# Patient Record
Sex: Female | Born: 1965 | Race: White | Hispanic: No | Marital: Married | Smoking: Former smoker
Health system: Southern US, Community
[De-identification: ages and names within clinical notes are randomized; demographics above are authoritative.]

## PROBLEM LIST (undated history)

## (undated) DIAGNOSIS — F909 Attention-deficit hyperactivity disorder, unspecified type: Secondary | ICD-10-CM

## (undated) DIAGNOSIS — M797 Fibromyalgia: Secondary | ICD-10-CM

## (undated) DIAGNOSIS — E039 Hypothyroidism, unspecified: Secondary | ICD-10-CM

## (undated) DIAGNOSIS — R000 Tachycardia, unspecified: Secondary | ICD-10-CM

## (undated) DIAGNOSIS — D509 Iron deficiency anemia, unspecified: Secondary | ICD-10-CM

## (undated) DIAGNOSIS — E611 Iron deficiency: Secondary | ICD-10-CM

## (undated) HISTORY — PX: LASIK: SHX215

## (undated) HISTORY — PX: CHOLECYSTECTOMY: SHX55

## (undated) HISTORY — PX: KNEE ARTHROSCOPY: SHX127

## (undated) HISTORY — DX: Iron deficiency anemia, unspecified: D50.9

## (undated) HISTORY — PX: GASTRIC BYPASS: SHX52

## (undated) HISTORY — DX: Iron deficiency: E61.1

## (undated) HISTORY — PX: BACK SURGERY: SHX140

## (undated) HISTORY — DX: Attention-deficit hyperactivity disorder, unspecified type: F90.9

---

## 2000-03-22 ENCOUNTER — Ambulatory Visit (HOSPITAL_COMMUNITY): Admission: RE | Admit: 2000-03-22 | Discharge: 2000-03-23 | Payer: Self-pay | Admitting: Neurosurgery

## 2001-02-10 ENCOUNTER — Encounter: Admission: RE | Admit: 2001-02-10 | Discharge: 2001-02-10 | Payer: Self-pay | Admitting: Neurosurgery

## 2004-03-13 ENCOUNTER — Encounter: Admission: RE | Admit: 2004-03-13 | Discharge: 2004-06-11 | Payer: Self-pay | Admitting: Surgery

## 2004-03-21 ENCOUNTER — Ambulatory Visit (HOSPITAL_COMMUNITY): Admission: RE | Admit: 2004-03-21 | Discharge: 2004-03-21 | Payer: Self-pay | Admitting: Surgery

## 2004-04-03 ENCOUNTER — Ambulatory Visit (HOSPITAL_COMMUNITY): Admission: RE | Admit: 2004-04-03 | Discharge: 2004-04-03 | Payer: Self-pay | Admitting: Surgery

## 2004-04-04 ENCOUNTER — Ambulatory Visit (HOSPITAL_COMMUNITY): Admission: RE | Admit: 2004-04-04 | Discharge: 2004-04-04 | Payer: Self-pay | Admitting: Surgery

## 2004-06-23 ENCOUNTER — Inpatient Hospital Stay (HOSPITAL_COMMUNITY): Admission: RE | Admit: 2004-06-23 | Discharge: 2004-06-27 | Payer: Self-pay | Admitting: Surgery

## 2004-07-04 ENCOUNTER — Encounter: Admission: RE | Admit: 2004-07-04 | Discharge: 2004-10-02 | Payer: Self-pay | Admitting: Surgery

## 2009-01-09 ENCOUNTER — Ambulatory Visit: Payer: Self-pay | Admitting: Urology

## 2009-01-30 ENCOUNTER — Emergency Department: Payer: Self-pay | Admitting: Unknown Physician Specialty

## 2010-07-05 ENCOUNTER — Encounter: Payer: Self-pay | Admitting: Surgery

## 2011-09-20 ENCOUNTER — Emergency Department (HOSPITAL_COMMUNITY)
Admission: EM | Admit: 2011-09-20 | Discharge: 2011-09-21 | Disposition: A | Discharge status: Home / Self Care | Payer: 59 | Attending: Emergency Medicine | Admitting: Emergency Medicine

## 2011-09-20 ENCOUNTER — Encounter (HOSPITAL_COMMUNITY): Payer: Self-pay | Admitting: Emergency Medicine

## 2011-09-20 DIAGNOSIS — J40 Bronchitis, not specified as acute or chronic: Secondary | ICD-10-CM

## 2011-09-20 DIAGNOSIS — Z79899 Other long term (current) drug therapy: Secondary | ICD-10-CM | POA: Insufficient documentation

## 2011-09-20 DIAGNOSIS — E039 Hypothyroidism, unspecified: Secondary | ICD-10-CM | POA: Insufficient documentation

## 2011-09-20 DIAGNOSIS — R059 Cough, unspecified: Secondary | ICD-10-CM | POA: Insufficient documentation

## 2011-09-20 DIAGNOSIS — R05 Cough: Secondary | ICD-10-CM | POA: Insufficient documentation

## 2011-09-20 DIAGNOSIS — R0602 Shortness of breath: Secondary | ICD-10-CM | POA: Insufficient documentation

## 2011-09-20 DIAGNOSIS — R062 Wheezing: Secondary | ICD-10-CM

## 2011-09-20 HISTORY — DX: Tachycardia, unspecified: R00.0

## 2011-09-20 HISTORY — DX: Hypothyroidism, unspecified: E03.9

## 2011-09-20 HISTORY — DX: Fibromyalgia: M79.7

## 2011-09-20 MED ORDER — IPRATROPIUM BROMIDE 0.02 % IN SOLN
RESPIRATORY_TRACT | Status: AC
  Administered 2011-09-20: 0.5 mg via RESPIRATORY_TRACT
  Filled 2011-09-20: qty 2.5

## 2011-09-20 MED ORDER — IPRATROPIUM BROMIDE 0.02 % IN SOLN
0.5000 mg | Freq: Once | RESPIRATORY_TRACT | Status: AC
  Administered 2011-09-20: 0.5 mg via RESPIRATORY_TRACT

## 2011-09-20 MED ORDER — ALBUTEROL SULFATE (5 MG/ML) 0.5% IN NEBU
2.5000 mg | INHALATION_SOLUTION | Freq: Once | RESPIRATORY_TRACT | Status: AC
  Administered 2011-09-20: 2.5 mg via RESPIRATORY_TRACT

## 2011-09-20 MED ORDER — ALBUTEROL SULFATE (5 MG/ML) 0.5% IN NEBU
INHALATION_SOLUTION | RESPIRATORY_TRACT | Status: AC
  Administered 2011-09-20: 2.5 mg via RESPIRATORY_TRACT
  Filled 2011-09-20: qty 1

## 2011-09-20 NOTE — ED Notes (Signed)
C/o non-productive cough since last night, sob and audible wheezing today.

## 2011-09-21 ENCOUNTER — Emergency Department (HOSPITAL_COMMUNITY): Payer: 59

## 2011-09-21 MED ORDER — ALBUTEROL SULFATE HFA 108 (90 BASE) MCG/ACT IN AERS
2.0000 | INHALATION_SPRAY | RESPIRATORY_TRACT | Status: DC | PRN
  Filled 2011-09-21: qty 6.7

## 2011-09-21 MED ORDER — PREDNISONE 20 MG PO TABS: 40.0000 mg | ORAL_TABLET | Freq: Every day | ORAL | Status: AC

## 2011-09-21 MED ORDER — IPRATROPIUM BROMIDE 0.02 % IN SOLN
0.5000 mg | Freq: Once | RESPIRATORY_TRACT | Status: AC
  Administered 2011-09-21: 0.5 mg via RESPIRATORY_TRACT
  Filled 2011-09-21: qty 2.5

## 2011-09-21 MED ORDER — PREDNISONE 20 MG PO TABS
60.0000 mg | ORAL_TABLET | Freq: Once | ORAL | Status: AC
  Administered 2011-09-21: 60 mg via ORAL
  Filled 2011-09-21: qty 3

## 2011-09-21 MED ORDER — ALBUTEROL SULFATE (5 MG/ML) 0.5% IN NEBU
5.0000 mg | INHALATION_SOLUTION | Freq: Once | RESPIRATORY_TRACT | Status: AC
  Administered 2011-09-21: 5 mg via RESPIRATORY_TRACT
  Filled 2011-09-21: qty 1

## 2011-09-21 NOTE — ED Notes (Signed)
Pt stated that she has been coughing since Saturday and today she begin having SOB. No CP. Will continue to monitor.

## 2011-09-21 NOTE — Discharge Instructions (Signed)

## 2011-09-21 NOTE — ED Provider Notes (Signed)
History     CSN: [id number]  Arrival date & time 09/20/11  2113   First MD Initiated Contact with Patient 09/21/11 0017      Chief Complaint  Patient presents with  . Shortness of Breath    (Consider location/radiation/quality/duration/timing/severity/associated sxs/prior treatment) Patient is a 46 y.o. female presenting with shortness of breath. The history is provided by the patient and the spouse.  Shortness of Breath  The current episode started today. The onset was gradual. The problem occurs continuously. The problem has been gradually worsening. The problem is severe. The symptoms are relieved by nothing. The symptoms are aggravated by activity and smoke exposure. Associated symptoms include cough, shortness of breath and wheezing. Pertinent negatives include no chest pressure, no fever and no rhinorrhea. The cough is non-productive. She has had no prior steroid use. She has had no prior hospitalizations. She has had no prior ICU admissions. She has had no prior intubations. Her past medical history is significant for past wheezing. She has been behaving normally. There were sick contacts at home (Husband recently got over pneumonia). She has received no recent medical care.    Past Medical History  Diagnosis Date  . Tachycardia   . Fibromyalgia   . Hypothyroid     Past Surgical History  Procedure Date  . Gastric bypass     No family history on file.  History  Substance Use Topics  . Smoking status: Never Smoker   . Smokeless tobacco: Not on file  . Alcohol Use: No    OB History    Grav Para Term Preterm Abortions TAB SAB Ect Mult Living                  Review of Systems  Constitutional: Negative for fever.  HENT: Negative for rhinorrhea.   Respiratory: Positive for cough, shortness of breath and wheezing.   All other systems reviewed and are negative.    Allergies  Review of patient's allergies indicates no known allergies.  Home Medications    Current Outpatient Rx  Name Route Sig Dispense Refill  . ATENOLOL 50 MG PO TABS Oral Take 50 mg by mouth daily.    Marland Kitchen CITALOPRAM HYDROBROMIDE 40 MG PO TABS Oral Take 40 mg by mouth daily.    . CYCLOBENZAPRINE HCL 10 MG PO TABS Oral Take 10 mg by mouth at bedtime.    . FUROSEMIDE 20 MG PO TABS Oral Take 20 mg by mouth daily.    . THYROID 90 MG PO TABS Oral Take 90 mg by mouth daily.    Marland Kitchen VITAMIN D (ERGOCALCIFEROL) 50000 UNITS PO CAPS Oral Take 50,000 Units by mouth every 7 (seven) days. On Sunday      BP 117/88  Pulse 100  Temp(Src) 97.3 F (36.3 C) (Oral)  Resp 21  SpO2 93%  LMP 08/30/2011  Physical Exam  Nursing note and vitals reviewed. Constitutional: She is oriented to person, place, and time. She appears well-developed and well-nourished. No distress.  HENT:  Head: Normocephalic and atraumatic.  Mouth/Throat: Oropharynx is clear and moist.  Eyes: EOM are normal. Pupils are equal, round, and reactive to light.  Cardiovascular: Normal rate, regular rhythm, normal heart sounds and intact distal pulses.  Exam reveals no friction rub.   No murmur heard. Pulmonary/Chest: Effort normal. She has wheezes in the right upper field, the right middle field, the right lower field, the left upper field, the left middle field and the left lower field. She has no rales.  Abdominal: Soft. Bowel sounds are normal. She exhibits no distension. There is no tenderness. There is no rebound and no guarding.  Musculoskeletal: Normal range of motion. She exhibits no tenderness.       No edema  Neurological: She is alert and oriented to person, place, and time. No cranial nerve deficit.  Skin: Skin is warm and dry. No rash noted.  Psychiatric: She has a normal mood and affect. Her behavior is normal.    ED Course  Procedures (including critical care time)  Labs Reviewed - No data to display Dg Chest 2 View  09/21/2011  *RADIOLOGY REPORT*  Clinical Data: Wheezing and shortness of breath.  CHEST -  2 VIEW  Comparison: Chest radiograph performed 03/21/2004  Findings: The lungs are well-aerated.  Chronic peribronchial thickening is noted.  There is no evidence of focal opacification, pleural effusion or pneumothorax.  The heart is normal in size; the mediastinal contour is within normal limits.  No acute osseous abnormalities are seen.  IMPRESSION: Chronic peribronchial thickening; lungs otherwise clear.  Original Report Authenticated By: Tonia Ghent, M.D.     1. Bronchitis   2. Wheezing       MDM   Patient with cough, wheezing, shortness of breath that started at 2 AM Sunday morning. Patient states it just worsened throughout the day and that's what made her decide to come today. On arrival here she was wheezing, tachypneic and very short of breath with an O2 sat of 93%. She states after her breathing treatments here Shireen Quan and feel better however on exam she still wheezing and mildly tachypneic. She states last night was the first time she had smoked in over a year the husband states she's been coughing intermittently for the last few days. Feel patient has most likely URI as well as some bronchospasm from the recent cigarette smoke exposure.  Will give second treatment of albuterol and Atrovent and prednisone. Chest x-ray pending.  2:00 AM  Patient is moving air better on exam. Still having diffuse wheezing but improved. Chest x-ray negative for acute findings other than bronchitis. Patient was tachycardic at 127 after having albuterol. Patient given prescription for albuterol inhaler and prednisone.  REcheck of HR improved to 95 with O2 sats of 95 prior to d/c.     Gwyneth Sprout, MD 09/21/11 [phone]

## 2012-02-10 ENCOUNTER — Encounter (HOSPITAL_COMMUNITY): Payer: Self-pay | Admitting: *Deleted

## 2012-02-10 ENCOUNTER — Emergency Department (INDEPENDENT_AMBULATORY_CARE_PROVIDER_SITE_OTHER): Payer: 59

## 2012-02-10 ENCOUNTER — Emergency Department (HOSPITAL_COMMUNITY)
Admission: EM | Admit: 2012-02-10 | Discharge: 2012-02-10 | Disposition: A | Discharge status: Home / Self Care | Payer: 59 | Source: Home / Self Care | Attending: Family Medicine | Admitting: Family Medicine

## 2012-02-10 DIAGNOSIS — S9030XA Contusion of unspecified foot, initial encounter: Secondary | ICD-10-CM

## 2012-02-10 DIAGNOSIS — S9031XA Contusion of right foot, initial encounter: Secondary | ICD-10-CM

## 2012-02-10 MED ORDER — DICLOFENAC POTASSIUM 50 MG PO TABS: 50.0000 mg | ORAL_TABLET | Freq: Three times a day (TID) | ORAL | Status: DC

## 2012-02-10 NOTE — ED Provider Notes (Signed)
History     CSN: [id number]  Arrival date & time 02/10/12  1814   First MD Initiated Contact with Patient 02/10/12 1832      Chief Complaint  Patient presents with  . Foot Pain    (Consider location/radiation/quality/duration/timing/severity/associated sxs/prior treatment) Patient is a 46 y.o. female presenting with foot injury. The history is provided by the patient.  Foot Injury  The incident occurred more than 1 week ago. Injury mechanism: tubing and walked on river rocks without shoes and began with right foot pain 2-3 days after, continues sore. The pain is present in the right foot. The quality of the pain is described as sharp. The pain is moderate. She reports no foreign bodies present. The symptoms are aggravated by bearing weight, palpation and activity.    Past Medical History  Diagnosis Date  . Tachycardia   . Fibromyalgia   . Hypothyroid     Past Surgical History  Procedure Date  . Gastric bypass     History reviewed. No pertinent family history.  History  Substance Use Topics  . Smoking status: Never Smoker   . Smokeless tobacco: Not on file  . Alcohol Use: No    OB History    Grav Para Term Preterm Abortions TAB SAB Ect Mult Living                  Review of Systems  Constitutional: Negative.   Musculoskeletal: Positive for gait problem.  Skin: Negative.     Allergies  Review of patient's allergies indicates no known allergies.  Home Medications   Current Outpatient Rx  Name Route Sig Dispense Refill  . ATENOLOL 50 MG PO TABS Oral Take 50 mg by mouth daily.    . BUPROPION HCL ER (XL) 150 MG PO TB24 Oral Take 150 mg by mouth daily.    Marland Kitchen VITAMIN D (ERGOCALCIFEROL) 50000 UNITS PO CAPS Oral Take 50,000 Units by mouth every 7 (seven) days. On Sunday    . CITALOPRAM HYDROBROMIDE 40 MG PO TABS Oral Take 40 mg by mouth daily.    . CYCLOBENZAPRINE HCL 10 MG PO TABS Oral Take 10 mg by mouth at bedtime.    Marland Kitchen DICLOFENAC POTASSIUM 50 MG PO TABS Oral  Take 1 tablet (50 mg total) by mouth 3 (three) times daily. 30 tablet 0  . FUROSEMIDE 20 MG PO TABS Oral Take 20 mg by mouth daily.    . THYROID 90 MG PO TABS Oral Take 90 mg by mouth daily.      BP 116/56  Pulse 80  Temp 98.8 F (37.1 C) (Oral)  Resp 20  SpO2 100%  LMP 12/30/2011  Physical Exam  Nursing note and vitals reviewed. Constitutional: She appears well-developed and well-nourished.  Musculoskeletal: She exhibits tenderness.       Right foot: She exhibits tenderness.       Feet:  Skin: Skin is warm and dry.    ED Course  Procedures (including critical care time)  Labs Reviewed - No data to display No results found.   1. Contusion of foot, right       MDM  X-rays reviewed and report per radiologist.         Linna Hoff, MD 02/14/12 [phone]

## 2012-02-10 NOTE — ED Notes (Signed)
Pt stepped on a sharp rock about 3 weeks ago and is still having pain in the right foot

## 2012-02-17 ENCOUNTER — Ambulatory Visit (INDEPENDENT_AMBULATORY_CARE_PROVIDER_SITE_OTHER): Payer: 59 | Admitting: Surgery

## 2012-02-17 ENCOUNTER — Encounter (INDEPENDENT_AMBULATORY_CARE_PROVIDER_SITE_OTHER): Payer: Self-pay | Admitting: Surgery

## 2012-02-17 DIAGNOSIS — Z9884 Bariatric surgery status: Secondary | ICD-10-CM | POA: Insufficient documentation

## 2012-02-17 NOTE — Progress Notes (Addendum)
Re:   Jessica Nunez DOB:   [birth year] MRN:   [id number]  ASSESSMENT AND PLAN: 1.  Morbid obesity.  Wants to consider a revision.  I'm not sure how much can/should be done until her fatigue is better addressed.  But I will start with an UGI to evaluate the pouch.  I also will get records form [name], NP, in [place] (supervising physician Dr. Evelena Asa).  I will review what labs have already been drawn.  [Her UGI is totally normal.  DN 02/22/2012]  2.  History of RYGB by Dr. Algis Downs. Jessica Nunez - 06/23/2004.  Her initial weight before gastric bypass - 232, BMI - 40.6 3.  History of chronic back pain. 4.  History of kidney stones. 5.  History of cigarette smoking.  Quit again in 2012. 6.  History of Fibromyalgia. 7.  On thyroid replacement  Armour extract for "fatigue"  TSH -  0.570 - 09/29/2011 8.  Low Vitamin D 9.  At least one hypoglycemic episode. 10.  ADD - on Wellbutrin  REFERRING PHYSICIAN: [name], MD  HISTORY OF PRESENT ILLNESS: Jessica Nunez is a 45 y.o. (DOB: Oct 08, 1965)  white female whose primary care physician is [name], NP, [place], [place], (FAX: [phone])( works with Dr. Vonita Moss) and comes to me today for weight gain after RYGB.  The patient had a gastric bypass on 23 June 2004 by Dr. Algis Downs. [place]. The last time I saw her was 21st of July 2006 and her weight at that time was 187 pounds and her BMI 33.1. She said her weight was stable at 167 pounds in 2011. [I did find in her records a weight of 167 in 2010 and a weight of 171 in 2011 and a weight of  206 in 2012.  Seen since 06/2011 by C. Wicker for "fatigue" about monthly.  Reviewed FAXed records from C. Wicker.  DN  02/21/2012]  Then between 2011-2012 she developed extreme fatigue and muscle soreness. She said she just could not move and she would be exhausted by the end of the day.  She's also had some constipation. She takes Colace about once a week and has vomiting which were 3 weeks, sometimes in the middle  the night. She doesn't relate the vomiting to be certain foods.  She is seeing [name], Nurse Practitioner, in [place] who is treating her with thyroid medicine and vitamin D. She also has had loose one documented episode of hypoglycemia.  She's had no evaluation of her gastric bypass.  Though I made the point that her weight gain has come with her decreased activity, she seemed to discount this.    Past Medical History  Diagnosis Date  . Tachycardia   . Fibromyalgia   . Hypothyroid      Past Surgical History  Procedure Date  . Gastric bypass     Current Outpatient Prescriptions  Medication Sig Dispense Refill  . atenolol (TENORMIN) 50 MG tablet Take 50 mg by mouth daily.      Marland Kitchen buPROPion (WELLBUTRIN XL) 150 MG 24 hr tablet Take 150 mg by mouth daily.      . citalopram (CELEXA) 40 MG tablet Take 40 mg by mouth daily.      . cyclobenzaprine (FLEXERIL) 10 MG tablet Take 10 mg by mouth at bedtime.      . furosemide (LASIX) 20 MG tablet Take 20 mg by mouth daily.      Marland Kitchen thyroid (ARMOUR) 90 MG tablet Take 90 mg by mouth  daily.      . Vitamin D, Ergocalciferol, (DRISDOL) 50000 UNITS CAPS Take 50,000 Units by mouth every 7 (seven) days. On Sunday      . diclofenac (CATAFLAM) 50 MG tablet Take 1 tablet (50 mg total) by mouth 3 (three) times daily.  30 tablet  0     No Known Allergies  REVIEW OF SYSTEMS: Skin:  No history of rash.  No history of abnormal moles. Infection:  No history of hepatitis or HIV.  No history of MRSA. Neurologic:  No history of stroke.  No history of seizure.  No history of headaches. Cardiac:  History of tachycardias, but has not had trouble in years.  The cardiologist she saw in [facility] is no longer in practice there.  Pulmonary:  Quit smoking again in 2012.  Endocrine:  On thyroid replacement (Armour Thyroid - 9 mg).  She said her vitamin D level has been low.  And she had one episode of hypoglycemia. Gastrointestinal:  No history of liver disease.  No  history of gall bladder disease.  No history of pancreas disease.  See HPI. Urologic:  Nephrotic syndrome as a child.  History of kidney stones, but has done well the last 3 years. Musculoskeletal:  Herniated disk repaired by Dr. Terrilee Files - 2001.  Right knee arthroscopy after accident.  History of carpel tunnel syndrome. Fatigue that is not well characterized or has an accompanying diagnosis. Hematologic:  No bleeding disorder.  No history of anemia.  Not anticoagulated. Psycho-social:  The patient is oriented.   Has history of adult ADD.  She saw Dr. Radene Gunning the first time.  SOCIAL and FAMILY HISTORY: Single, but lives with Tim.  No immediate plans for marriage.  She said that because of her current problems, her relationship is "suffering". She had a bad marriage before and [name] has been married twice, so they are in no hurry to try again.. 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  PHYSICAL EXAM: BP 136/80  Pulse 80  Temp 98.6 F (37 C) (Temporal)  Resp 18  Ht 5\' 4"  (1.626 m)  Wt 221 lb 9.6 oz (100.517 kg)  BMI 38.04 kg/m2  SpO2 99%  LMP 12/30/2011  General: WN WF, moderately obese, who is alert and generally healthy appearing.  HEENT: Normal. Pupils equal. Neck: Supple. No mass.  No thyroid mass. Lymph Nodes:  No supraclavicular or cervical nodes. Lungs: Clear to auscultation and symmetric breath sounds. Heart:  RRR. No murmur or rub. Abdomen: Soft. No mass. No tenderness. No hernia. Normal bowel sounds.  Scars look okay. Rectal: Not done. Extremities:  Good strength and ROM  in upper and lower extremities. Neurologic:  Grossly intact to motor and sensory function. Psychiatric: Has normal mood and affect. Behavior is normal.   DATA REVIEWED: Old chart.  Ovidio Kin, MD,  [facility] [address] [place]  [address]   [place], [place]    [address] Phone:  [phone] FAX:  [phone]

## 2012-02-22 ENCOUNTER — Ambulatory Visit
Admission: RE | Admit: 2012-02-22 | Discharge: 2012-02-22 | Disposition: A | Discharge status: Home / Self Care | Payer: 59 | Source: Ambulatory Visit | Attending: Surgery | Admitting: Surgery

## 2012-02-22 DIAGNOSIS — Z9884 Bariatric surgery status: Secondary | ICD-10-CM

## 2012-03-09 ENCOUNTER — Encounter (INDEPENDENT_AMBULATORY_CARE_PROVIDER_SITE_OTHER): Payer: Self-pay

## 2012-03-15 ENCOUNTER — Ambulatory Visit: Payer: Self-pay

## 2012-03-16 ENCOUNTER — Ambulatory Visit (INDEPENDENT_AMBULATORY_CARE_PROVIDER_SITE_OTHER): Payer: 59 | Admitting: Surgery

## 2012-03-16 ENCOUNTER — Encounter (INDEPENDENT_AMBULATORY_CARE_PROVIDER_SITE_OTHER): Payer: Self-pay | Admitting: Surgery

## 2012-03-16 VITALS — BP 110/72 | HR 77 | Temp 98.8°F | Resp 16 | Ht 63.0 in | Wt 217.8 lb

## 2012-03-16 DIAGNOSIS — Z9884 Bariatric surgery status: Secondary | ICD-10-CM

## 2012-03-16 NOTE — Progress Notes (Signed)
Re:   Jessica Nunez DOB:   [birth year] MRN:   [id number]  ASSESSMENT AND PLAN: 1.  Morbid obesity.  She is concerned that her bypass is not working and she came with the question of whether a "revision" would help her.  Her UGI was normal.  There is no problem with the bypass surgery.  I don't think that a revision will improve her weight loss.  I reviewed records from [name], NP, in [place] (supervising physician Dr. Evelena Asa).   For now, I will let [name] workup [name].  I will see [name] back in 3 months and see how she is doing with her anemia and symtoms.  2.  History of RYGB by Dr. Algis Downs. [name] - 06/23/2004.  Her initial weight before gastric bypass - 232, BMI - 40.6 3.  History of chronic back pain.  But no chronic pain meds.  This dates back to 2000. 4.  History of kidney stones. 5.  History of cigarette smoking.  Quit again in 2012. 6.  History of Fibromyalgia. 7.  On thyroid replacement  Armour extract for "fatigue"  TSH - 0.248 - 03/07/2012 8.  Low Vitamin D  Now better - Vit D - 113 (range 30-100) - 03/07/2012 9.  At least one hypoglycemic episode. 10.  ADD - on Wellbutrin 11.  Anemic - Hgb - 7.5 - 03/09/2012  [name] is working this up and suggesting a colonoscopy.  REFERRING PHYSICIAN: [name], MD  HISTORY OF PRESENT ILLNESS: Jessica Nunez is a 46 y.o. (DOB: 1966-03-21)  white female whose primary care physician is [name], NP, [place], [place], (FAX: [phone])( works with Dr. Vonita Moss) and comes to me for follow up of a discussion about weight gain after RYGB.  I saw her 02/17/2012.  I did an UGI on 02/22/2012, which was negative.  Since I saw her, she has found out that she is anemic and [name] is working this up. She is planning some more blood work and a colonoscopy.  [name] still says that she feels nauseated and vomits every 3 ot 4 weeks.  She feels this particularly after being constipated.   Gastric Bypass  History: The patient had a gastric bypass on 23 June 2004 by Dr. Algis Downs. [name]. The last time I saw her was 21st of July 2006 and her weight at that time was 187 pounds and her BMI 33.1. She said her weight was stable at 167 pounds in 2011.  I did find in her records a weight of 167 in 2010 and a weight of 171 in 2011 and a weight of  206 in 2012.  [name] has been seen since 06/2011 by C. Wicker for "fatigue" about monthly.   Then between 2011-2012 she developed extreme fatigue and muscle soreness. She said she just could not move and she would be exhausted by the end of the day.  She's also had some constipation.  he is seeing [name], Nurse Practitioner, in [place] who is treating her with thyroid medicine and vitamin D. She also has had loose one documented episode of hypoglycemia.    [facility] FAXed records are scanned in under "media" in Epic.]    Past Medical History  Diagnosis Date  . Tachycardia   . Fibromyalgia   . Hypothyroid      Past Surgical History  Procedure Date  . Gastric bypass     Current Outpatient Prescriptions  Medication Sig Dispense Refill  . amphetamine-dextroamphetamine (  ADDERALL) 15 MG tablet       . atenolol (TENORMIN) 50 MG tablet Take 50 mg by mouth daily.      Marland Kitchen buPROPion (WELLBUTRIN XL) 150 MG 24 hr tablet Take 150 mg by mouth daily.      . citalopram (CELEXA) 40 MG tablet Take 40 mg by mouth daily.      . cyclobenzaprine (FLEXERIL) 10 MG tablet Take 10 mg by mouth at bedtime.      . ferrous sulfate 325 (65 FE) MG tablet Take 325 mg by mouth 3 (three) times daily with meals.      . furosemide (LASIX) 20 MG tablet Take 20 mg by mouth daily.      Marland Kitchen thyroid (ARMOUR) 90 MG tablet Take 90 mg by mouth daily.      . Vitamin D, Ergocalciferol, (DRISDOL) 50000 UNITS CAPS Take 50,000 Units by mouth every 7 (seven) days. On Sunday         No Known Allergies  REVIEW OF SYSTEMS: Skin:  No history of rash.  No history of abnormal moles. Infection:  No history of  hepatitis or HIV.  No history of MRSA. Neurologic:  No history of stroke.  No history of seizure.  No history of headaches. Cardiac:  History of tachycardias, but has not had trouble in years.  The cardiologist she saw in [facility] is no longer in practice there.  Pulmonary:  Quit smoking again in 2012.  Endocrine:  On thyroid replacement (Armour Thyroid - 9 mg).  She said her vitamin D level has been low.  And she had one episode of hypoglycemia. Gastrointestinal:  No history of liver disease.  No history of gall bladder disease.  No history of pancreas disease.  See HPI. Urologic:  Nephrotic syndrome as a child.  History of kidney stones, but has done well the last 3 years. Musculoskeletal:  Herniated disk repaired by Dr. Terrilee Files - 2001.  Right knee arthroscopy after accident.  History of carpel tunnel syndrome. Fatigue that is not well characterized or has an accompanying diagnosis. Hematologic:  No bleeding disorder.  No history of anemia.  Not anticoagulated. Psycho-social:  The patient is oriented.   Has history of adult ADD.  She saw Dr. Radene Gunning the first time.  SOCIAL and FAMILY HISTORY: Single, but lives with Tim.  No immediate plans for marriage.  She said that because of her current problems, her relationship is "suffering". She had a bad marriage before and Jessica Nunez has been married twice, so they are in no hurry to try again.. 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  PHYSICAL EXAM: BP 110/72  Pulse 77  Temp 98.8 F (37.1 C) (Temporal)  Resp 16  Ht 5\' 3"  (1.6 m)  Wt 217 lb 12.8 oz (98.793 kg)  BMI 38.58 kg/m2  SpO2 97%  LMP 02/15/2012  General: WN WF, moderately obese, who is alert and generally healthy appearing.  HEENT: Normal. Pupils equal. Neck: Supple. No mass.  No thyroid mass. Lymph Nodes:  No supraclavicular or cervical nodes. Lungs: Clear to auscultation and symmetric breath sounds. Heart:  RRR. No murmur or rub.  Abdomen: Soft. No  mass. No tenderness. No hernia. Normal bowel sounds.  Scars look okay. Extremities:  Good strength and ROM  in upper and lower extremities. Neurologic:  Grossly intact to motor and sensory function. Psychiatric: Has normal mood and affect. Behavior is normal.   DATA REVIEWED: Hgb - 7.5 - 03/07/2012. MCV - 67 -  03/07/2012. TSH - 0.248 - 03/07/2012  Ovidio Kin, MD,  [facility] [address] [place]  [address]   [place], [place]    [address] Phone:  [phone] FAX:  [phone]

## 2012-03-23 ENCOUNTER — Encounter (INDEPENDENT_AMBULATORY_CARE_PROVIDER_SITE_OTHER): Payer: Self-pay

## 2012-04-09 ENCOUNTER — Encounter (INDEPENDENT_AMBULATORY_CARE_PROVIDER_SITE_OTHER): Payer: Self-pay | Admitting: Surgery

## 2012-04-14 HISTORY — PX: ESOPHAGOGASTRODUODENOSCOPY: SHX1529

## 2012-04-14 HISTORY — PX: COLONOSCOPY: SHX174

## 2012-05-10 ENCOUNTER — Encounter (HOSPITAL_COMMUNITY): Payer: 59 | Attending: Oncology | Admitting: Oncology

## 2012-05-10 ENCOUNTER — Encounter (HOSPITAL_COMMUNITY): Payer: Self-pay | Admitting: Oncology

## 2012-05-10 VITALS — BP 140/89 | HR 77 | Temp 98.4°F | Resp 18 | Ht 63.25 in | Wt 221.6 lb

## 2012-05-10 DIAGNOSIS — F3289 Other specified depressive episodes: Secondary | ICD-10-CM | POA: Insufficient documentation

## 2012-05-10 DIAGNOSIS — Z9884 Bariatric surgery status: Secondary | ICD-10-CM

## 2012-05-10 DIAGNOSIS — F988 Other specified behavioral and emotional disorders with onset usually occurring in childhood and adolescence: Secondary | ICD-10-CM | POA: Insufficient documentation

## 2012-05-10 DIAGNOSIS — F329 Major depressive disorder, single episode, unspecified: Secondary | ICD-10-CM | POA: Insufficient documentation

## 2012-05-10 DIAGNOSIS — F17200 Nicotine dependence, unspecified, uncomplicated: Secondary | ICD-10-CM | POA: Insufficient documentation

## 2012-05-10 DIAGNOSIS — E039 Hypothyroidism, unspecified: Secondary | ICD-10-CM | POA: Insufficient documentation

## 2012-05-10 DIAGNOSIS — E669 Obesity, unspecified: Secondary | ICD-10-CM | POA: Insufficient documentation

## 2012-05-10 DIAGNOSIS — D509 Iron deficiency anemia, unspecified: Secondary | ICD-10-CM

## 2012-05-10 HISTORY — DX: Iron deficiency anemia, unspecified: D50.9

## 2012-05-10 LAB — CBC WITH DIFFERENTIAL/PLATELET
Basophils Absolute: 0.1 10*3/uL (ref 0.0–0.1)
Lymphs Abs: 2.2 10*3/uL (ref 0.7–4.0)
MCH: 25.7 pg — ABNORMAL LOW (ref 26.0–34.0)
MCV: 82 fL (ref 78.0–100.0)
Monocytes Absolute: 0.4 10*3/uL (ref 0.1–1.0)
Neutrophils Relative %: 57 % (ref 43–77)
Platelets: 363 10*3/uL (ref 150–400)
RDW: 24.1 % — ABNORMAL HIGH (ref 11.5–15.5)
WBC: 6.9 10*3/uL (ref 4.0–10.5)

## 2012-05-10 NOTE — Progress Notes (Signed)
Jessica Nunez presented for labwork. Labs per MD order drawn via Peripheral Line 23 gauge needle inserted in right AC  Good blood return present. Procedure without incident.  Needle removed intact. Patient tolerated procedure well.

## 2012-05-10 NOTE — Patient Instructions (Addendum)
[facility]  Discharge Instructions  RECOMMENDATIONS MADE BY THE CONSULTANT AND ANY TEST RESULTS WILL BE SENT TO YOUR REFERRING DOCTOR.   EXAM FINDINGS BY MD TODAY AND SIGNS AND SYMPTOMS TO REPORT TO CLINIC OR PRIMARY MD: discussion by PA and MD.  We will check some blood work today and get you scheduled for a feraheme infusion.  MEDICATIONS PRESCRIBED: Decrease your iron tablet to 1 per day.   INSTRUCTIONS GIVEN AND DISCUSSED: Other :  Report increased ice intake, increased fatigue or shortness of breath.  SPECIAL INSTRUCTIONS/FOLLOW-UP: Lab work Needed today and in 1 month & 3 months and Return to Clinic to see PA in 3 months after iron infusion.   I acknowledge that I have been informed and understand all the instructions given to me and received a copy. I do not have any more questions at this time, but understand that I may call the Specialty Clinic at [facility] at [phone] during business hours should I have any further questions or need assistance in obtaining follow-up care.    __________________________________________  _____________  __________ Signature of Patient or Authorized Representative            Date                   Time    __________________________________________ Nurse's Signature

## 2012-05-10 NOTE — Progress Notes (Signed)
[facility] NEW PATIENT EVALUATION   Name: Jessica Nunez Date: 05/10/2012 MRN: [id number] DOB: 08/29/1965    CC: [name], MD     DIAGNOSIS: Iron Deficiency Anemia   HISTORY OF PRESENT ILLNESS:Jessica Nunez Pandy is a 46 y.o. Caucasian female who is referred to the [facility] for Iron Deficiency Anemia with a past medical history significant for gastric bypass in 2006, ADD, heart palpitations,. Depression, hypothyroidism, obesity, and tobacco abuse.  The patient reports that she went to her PCP and she was discovered to be iron deficient and anemic.  She was placed on ferrous sulfate 325 mg PO TID starting on 03/15/2012.  She has been on it since.  So she has had nearly 8 weeks worth of therapy.  Interestingly, her labs show an improvement, but she has not completely replenished her iron.  She was subsequently referred to [facility] where she underwent EGD and Colonoscopy.  No source of bleeding identified.  She did not have a camera capsule study.  She was hence referred to hematology as a result.   She admits to having Gastric bypass in 2006 by Dr. Ezzard Standing in [place].  She initially lost 130 lbs, but has unfortunately gained her weight back.  She admits to nausea, headaches, and constipation from her ferrous sulfate 325 mg.  I provided the patient education that 1 tablet daily is enough to fully saturate the iron receptors in the gut and taking three tablets daily is too much and not conferring any benefit for the patient.  She was asked to decrease her PO iron to once daily.   She admits to feeling "run-down," fatigued, tired.  She admits to severe ice cravings before starting her PO iron.  She reports that she "dreamed and obsessed over ice."  Her favorite brand was ready-ice.  She would eat a 10 lb bag of ice every night after work.  This did not include the ice she ate during the day at work.  Her co-workers were frustrated with the patient for the "crunching" of  ice she would do at work.  Interestingly, she also is craving Gatorade which she never liked in the past, but now "cannot get enough of it."  She denies any dirt, clay, or starch cravings.  She denies any dizziness, double vision, fevers, chills, night sweats, vomiting, diarrhea, abdominal pain, chest pain, heart palpitations, blood in stool, black tarry stool, urinary pain/burning/frequency, hematuria, menorrhagia.  Her last menstru ation date was last weekend 11/22- 11/24 and it was her usual scant bleeding lasting only 2-3 days.   FAMILY HISTORY: family history includes Cancer in her father and Diabetes in her father and mother. The patient's father is deceased at the age of  47 from MI but he also had prostate cancer.  The patient's mother is deceased from an MI as well at the age of 16.  She has a 37 year old brother who has metastatic prostate cancer.  Another brother is 30 years old and he has Lyme's Disease which was diagnosed in the 1990's.  She has one sister who is aged 29 who is healthy to her knowledge.  She does not have any children.  She has a maternal grandfather who had colonc cancer and numerous paternal aunts and uncles with cancers.  PAST MEDICAL HISTORY:  has a past medical history of Tachycardia; Fibromyalgia; Hypothyroid; and Iron deficiency.  ADD     CURRENT MEDICATIONS: See [facility].  Of note: Ferrous sulfate 325 mg  TID starting on 03/15/2012   SOCIAL HISTORY: The patient lives in [place], [place].  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  She is divorced.  She was married for 4 years.  She was divorced in 2011 and now is engaged.  She reports that this is a good relationship for her.  She Denies EtOH and illicit drug abuse.  She admits to a history of smoking 1 ppd x 25 years, quitting in 2011.  She is now back to smoking 1 pack per week.   ALLERGIES: Review of patient's allergies indicates  no known allergies.   LABORATORY DATA:  03/07/2012: HGB 7.5, HCT 26.9%, MCV 67, RDW 17.1%, Vit D 113. 03/15/2012: HGB 7.5, MCV 65, Serum Iron 11, TIBC 442, Ferritin 3, Vit B12 1252, Iron Sat 2%, Folate 15.1. 03/23/2012: HGB 7.1, HCT 25%, MCV 68,  04/01/2012: HGB 9.6, NCT 31.7%, MCV 73   RADIOGRAPHY: No results found.     PHYSICAL EXAM:  height is 5' 3.25" (1.607 m) and weight is 221 lb 9.6 oz (100.517 kg). Her oral temperature is 98.4 F (36.9 C). Her blood pressure is 140/89 and her pulse is 77. Her respiration is 18.  General appearance: alert, cooperative, no distress and morbidly obese Head: Normocephalic, without obvious abnormality, atraumatic Neck: no adenopathy, supple, symmetrical, trachea midline and thyroid not enlarged, symmetric, no tenderness/mass/nodules Lymph nodes: Cervical, supraclavicular, and axillary nodes normal. Resp: clear to auscultation bilaterally and normal percussion bilaterally Back: symmetric, no curvature. ROM normal. No CVA tenderness. Cardio: regular rate and rhythm, S1, S2 normal, no murmur, click, rub or gallop GI: soft, non-tender; bowel sounds normal; no masses,  no organomegaly Extremities: edema 1+ LE edema, BL Neurologic: Alert and oriented X 3, normal strength and tone. Normal symmetric reflexes. Normal coordination and gait     IMPRESSION:  1. Iron Deficiency Anemia, negative colonoscopy and EGD.  Camera capsule study not performed. S/P Gastric Bypass 2. Gastric Bypass in 2006 by Dr. Ezzard Standing. 3. ADD 4. History of heart palpitations 5. Depression 6. Hypothyroidism 7. Obesity 8. Tobacco abuse, H/O 1 ppd x 25 years quitting in 2011 or 2012, now back to smoking 1 pack per week.   PLAN:  1. I personally reviewed and went over laboratory results with the patient. 2. Lab work today: CBC dif, Iron/TIBC, Ferritin, MMA level, and Copper level 3. Feraheme 1020 mg x 1 IV  4. Decrease Ferrous sulfate 325 mg once daily. 5. Patient will continue  with daily iron PO, even after IV iron. 6. If patient loses iron after IV iron infusion will refer patient back to GI for consideration of camera capsule study. 7. Labs 4 weeks and 12 weeks after infusion: CBC diff, Iron/TIBC, Ferritin 8. Patient education regarding iron deficiency anemia.  9. Smoking cessation education provided.  10. Return in 3 months for follow-up.  All questions were answered.  Patient knows to call the clinic with any questions or concerns.   Patient and plan discussed with Dr. Glenford Peers and he is in agreement with the aforementioned.   Decorian Schuenemann

## 2012-05-11 ENCOUNTER — Telehealth (HOSPITAL_COMMUNITY): Payer: Self-pay | Admitting: Oncology

## 2012-05-11 LAB — IRON AND TIBC
Iron: 45 ug/dL (ref 42–135)
TIBC: 327 ug/dL (ref 250–470)

## 2012-05-11 NOTE — Telephone Encounter (Signed)
Patient called regarding labs and informed that we would cancel her Feraheme infusion and she would return for labs as scheduled.  Patient confirmed that she was taking her Iron and would continue to do so.

## 2012-05-20 ENCOUNTER — Ambulatory Visit (HOSPITAL_COMMUNITY): Payer: 59

## 2012-05-31 ENCOUNTER — Encounter (HOSPITAL_COMMUNITY): Payer: Self-pay | Admitting: Oncology

## 2012-06-06 ENCOUNTER — Ambulatory Visit (HOSPITAL_COMMUNITY): Payer: 59

## 2012-06-20 ENCOUNTER — Encounter (HOSPITAL_COMMUNITY): Payer: 59 | Attending: Oncology

## 2012-06-20 ENCOUNTER — Other Ambulatory Visit (HOSPITAL_COMMUNITY): Payer: 59

## 2012-06-20 DIAGNOSIS — D509 Iron deficiency anemia, unspecified: Secondary | ICD-10-CM | POA: Insufficient documentation

## 2012-06-20 DIAGNOSIS — S9030XA Contusion of unspecified foot, initial encounter: Secondary | ICD-10-CM

## 2012-06-20 DIAGNOSIS — Z9884 Bariatric surgery status: Secondary | ICD-10-CM

## 2012-06-20 LAB — FERRITIN: Ferritin: 31 ng/mL (ref 10–291)

## 2012-06-20 LAB — IRON AND TIBC
Saturation Ratios: 42 % (ref 20–55)
UIBC: 198 ug/dL (ref 125–400)

## 2012-06-20 LAB — CBC WITH DIFFERENTIAL/PLATELET
Eosinophils Relative: 6 % — ABNORMAL HIGH (ref 0–5)
HCT: 45.1 % (ref 36.0–46.0)
Hemoglobin: 14.7 g/dL (ref 12.0–15.0)
Lymphocytes Relative: 38 % (ref 12–46)
Lymphs Abs: 2.9 10*3/uL (ref 0.7–4.0)
MCH: 28.5 pg (ref 26.0–34.0)
MCV: 87.6 fL (ref 78.0–100.0)
Monocytes Absolute: 0.7 10*3/uL (ref 0.1–1.0)
Monocytes Relative: 8 % (ref 3–12)
RBC: 5.15 MIL/uL — ABNORMAL HIGH (ref 3.87–5.11)
WBC: 7.7 10*3/uL (ref 4.0–10.5)

## 2012-06-20 NOTE — Progress Notes (Signed)
Labs drawn today for cbc/diff,ferr,Iron and IBC 

## 2012-06-24 ENCOUNTER — Other Ambulatory Visit: Payer: Self-pay | Admitting: Otolaryngology

## 2012-06-24 DIAGNOSIS — E039 Hypothyroidism, unspecified: Secondary | ICD-10-CM

## 2012-06-24 DIAGNOSIS — R490 Dysphonia: Secondary | ICD-10-CM

## 2012-07-11 ENCOUNTER — Ambulatory Visit
Admission: RE | Admit: 2012-07-11 | Discharge: 2012-07-11 | Disposition: A | Discharge status: Home / Self Care | Payer: 59 | Source: Ambulatory Visit | Attending: Otolaryngology | Admitting: Otolaryngology

## 2012-07-11 DIAGNOSIS — E039 Hypothyroidism, unspecified: Secondary | ICD-10-CM

## 2012-07-11 DIAGNOSIS — R490 Dysphonia: Secondary | ICD-10-CM

## 2012-07-14 ENCOUNTER — Telehealth (INDEPENDENT_AMBULATORY_CARE_PROVIDER_SITE_OTHER): Payer: Self-pay | Admitting: General Surgery

## 2012-07-14 ENCOUNTER — Encounter (INDEPENDENT_AMBULATORY_CARE_PROVIDER_SITE_OTHER): Payer: Self-pay | Admitting: General Surgery

## 2012-07-14 NOTE — Telephone Encounter (Signed)
Called patient back to talk to her about her apt if it was with Dr Johna Sheriff, it was on 08-16-12. Patient is a patient of Dr Ezzard Standing she wanted to be reschedule back to Dr Ezzard Standing on 08-19-12 @ 11:40. I told her if she is having any problems to please call back here and we can help her and she can also ask for [facility] Dr Ezzard Standing nurse.

## 2012-07-30 ENCOUNTER — Other Ambulatory Visit: Payer: Self-pay

## 2012-08-10 ENCOUNTER — Encounter (HOSPITAL_COMMUNITY): Payer: Self-pay | Admitting: Oncology

## 2012-08-15 ENCOUNTER — Other Ambulatory Visit (HOSPITAL_COMMUNITY): Payer: 59

## 2012-08-17 ENCOUNTER — Ambulatory Visit (HOSPITAL_COMMUNITY): Payer: 59 | Admitting: Oncology

## 2012-08-19 ENCOUNTER — Ambulatory Visit (INDEPENDENT_AMBULATORY_CARE_PROVIDER_SITE_OTHER): Payer: Self-pay | Admitting: Surgery

## 2012-08-22 ENCOUNTER — Telehealth (INDEPENDENT_AMBULATORY_CARE_PROVIDER_SITE_OTHER): Payer: Self-pay

## 2012-08-22 NOTE — Telephone Encounter (Signed)
Pt aware of appt 08/23/12 @ 1205p with Dr. Ezzard Standing

## 2012-08-23 ENCOUNTER — Other Ambulatory Visit (INDEPENDENT_AMBULATORY_CARE_PROVIDER_SITE_OTHER): Payer: Self-pay

## 2012-08-23 ENCOUNTER — Ambulatory Visit (INDEPENDENT_AMBULATORY_CARE_PROVIDER_SITE_OTHER): Payer: 59 | Admitting: Surgery

## 2012-08-23 ENCOUNTER — Encounter (INDEPENDENT_AMBULATORY_CARE_PROVIDER_SITE_OTHER): Payer: Self-pay | Admitting: Surgery

## 2012-08-23 VITALS — BP 132/84 | HR 86 | Temp 98.0°F | Resp 18 | Ht 64.0 in | Wt 223.0 lb

## 2012-08-23 DIAGNOSIS — Z9884 Bariatric surgery status: Secondary | ICD-10-CM

## 2012-08-23 NOTE — Progress Notes (Addendum)
Re:   Jessica Nunez DOB:   1966-01-02 MRN:   [id number]  ASSESSMENT AND PLAN: 1.  Morbid obesity.  Her RYGB looks okay.  Plan:  1.) she is concerned about her "thyroid" and she read something about nephrotic syndrome (which she had as a child) being associated with adrenal abnormalities.  So we will refer her to an endocrinologist for his opinion., 2.) We will have the [facility] nutritionists, Jessica Nunez, who deals with our bariatric patients, see the patient for diet advice, 3.) She is exercising better, but our goal is 1 hour daily at least 5 days a week.  She'll see me back in 4 to 5 months.  2.  History of RYGB by Dr. Algis Nunez. Jessica Nunez - 06/23/2004.  Her initial weight before gastric bypass - 232, BMI - 40.6  She has gained most of her weight back.  One of her issues, Fe deficiency, is now corrected.  Her UGI (02/22/2012) was normal with normal sized post op pouch.  She had an endoscopy by Dr. Evette Nunez (04/14/2012?) that was normal - I'll try to get those records.   [Patient has outstanding bills with [facility] - she does not plan to pay.  DN 09/14/2012]  3.  History of chronic back pain.  But no chronic pain meds.  This dates back to 2000. 4.  History of kidney stones. 5.  History of cigarette smoking.  She says that she is smoking about 2 to 4 cigarettes/week. 6.  History of Fibromyalgia. 7.  On thyroid replacement  Armour extract for "fatigue"  TSH - 0.248 - 03/07/2012 8.  Low Vitamin D  Now better - Vit D - 113 (range 30-100) - 03/07/2012 9.  At least one hypoglycemic episode. 10.  ADD - on Wellbutrin 11.  Anemia, improved - Hgb - 14.7 - 1/6//2014  REFERRING PHYSICIAN: [name], MD  HISTORY OF PRESENT ILLNESS: Jessica Nunez is a 47 y.o. (DOB: Oct 30, 1965)  white female whose primary care physician is Jessica Cirri, NP, [place], [place], (FAX: [phone])( works with Dr. Vonita Nunez) and comes to me for follow up of a discussion about weight gain after RYGB.  I last saw her 03/16/2012.  She is  overall doing much better.  Her Fe is better and her anemia is corrected.  Her ice craving, which was distracting in her life, is improved.  She saw Jessica Nunez, a PA that works with the hematologist, who has helped with the Fe deficiency,  She had a negative upper endo and colonoscopy by Dr. Evette Nunez (I have not see his notes).  She has read that people who have nephrotic syndrome can have adrenal problems.  She had nephrotic syndrome as a child, her last creatinine that I can find is normal, and she has had no recent renal problems.  But she is concerned about this and asked about seeing an endocrinologist.  I told her we could help arrange the appointment and the information that the endocrinologist provides could be helpful.  She is convinced that something else is going on to make her gain weight.   I did an UGI on 02/22/2012, which was negative.  I don't see any surgical option to help her with her weight.  But in addition to seeing an endocrinolgist, I think it would be helpful for her to see the Jessica Nunez nutritionist who deals with our bariatric patients.  Gastric Bypass History: The patient had a gastric bypass on 23 June 2004 by Dr. Algis Nunez. Jessica Nunez. The last time  I saw her was 21st of July 2006 and her weight at that time was 187 pounds and her BMI 33.1. She said her weight was stable at 167 pounds in 2011.  I did find in her records a weight of 167 in 2010 and a weight of 171 in 2011 and a weight of  206 in 2012.  Jessica Nunez has been seen since 06/2011 by Jessica Nunez for "fatigue" about monthly.   Then between 2011-2012 she developed extreme fatigue and muscle soreness. She said she just could not move and she would be exhausted by the end of the day.  She's also had some constipation.  he is seeing Jessica Nunez, Nurse Practitioner, in [place] who is treating her with thyroid medicine and vitamin D. She also has had loose one documented episode of hypoglycemia.    [facility] FAXed records are scanned in under  "media" in Epic.]    Past Medical History  Diagnosis Date  . Tachycardia   . Fibromyalgia   . Hypothyroid   . Iron deficiency   . Iron deficiency anemia 05/10/2012     Past Surgical History  Procedure Laterality Date  . Gastric bypass    . Colonoscopy  04/14/12  . Esophagogastroduodenoscopy  04/14/12  . Knee arthroscopy      right  . Lasik    . Back surgery      Current Outpatient Prescriptions  Medication Sig Dispense Refill  . amphetamine-dextroamphetamine (ADDERALL) 15 MG tablet 15 mg 2 (two) times daily.       Marland Kitchen atenolol (TENORMIN) 50 MG tablet Take 50 mg by mouth daily.      Marland Kitchen buPROPion (WELLBUTRIN XL) 150 MG 24 hr tablet Take 150 mg by mouth daily.      . citalopram (CELEXA) 40 MG tablet Take 40 mg by mouth daily.      . cyclobenzaprine (FLEXERIL) 10 MG tablet Take 10 mg by mouth at bedtime.      . ferrous sulfate 325 (65 FE) MG tablet Take 325 mg by mouth 3 (three) times daily with meals.      . furosemide (LASIX) 20 MG tablet Take 20 mg by mouth daily.      Marland Kitchen thyroid (ARMOUR) 90 MG tablet Take 90 mg by mouth daily.      . Vitamin D, Ergocalciferol, (DRISDOL) 50000 UNITS CAPS Take 50,000 Units by mouth every 7 (seven) days. On Sunday       No current facility-administered medications for this visit.     No Known Allergies  REVIEW OF SYSTEMS: Cardiac:  History of tachycardias, but has not had trouble in years.  The cardiologist she saw in [facility] [place] [facility] is no longer in practice there.  Pulmonary:  Quit smoking again in 2012.  Endocrine:  On thyroid replacement (Armour Thyroid - 9 mg).  She said her vitamin D level has been low.  And she had one episode of hypoglycemia. Gastrointestinal:  No history of liver disease.  No history of gall bladder disease.  No history of pancreas disease.  See HPI. Urologic:  Nephrotic syndrome as a child.  Creatinine 0.82 - 03/31/2011.   History of kidney stones, but has done well the last 3 years. Musculoskeletal:  Herniated disk repaired  by Dr. Terrilee Files - 2001.  Right knee arthroscopy after accident.  History of carpel tunnel syndrome. Fatigue that is not well characterized or has an accompanying diagnosis. Hematologic: Fe deficiency anemia - corrected.  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 Psycho-social:  The patient is oriented.   Has history of adult ADD.  She saw Dr. Cyndia Skeeters the first time.  SOCIAL and FAMILY HISTORY: Single, but lives with Tim.  No immediate plans for marriage.  She said that because of her current problems, her relationship is "suffering". She had a bad marriage before and [name] has been married twice, so they are in no hurry to try again.. 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  PHYSICAL EXAM: BP 132/84  Pulse 86  Temp(Src) 98 F (36.7 C)  Resp 18  Ht 5\' 4"  (1.626 m)  Wt 223 lb (101.152 kg)  BMI 38.26 kg/m2  General: WN WF, moderately obese, who is alert and generally healthy appearing.  HEENT: Normal. Pupils equal. Lungs: Clear to auscultation and symmetric breath sounds. Heart:  RRR. No murmur or rub.  Abdomen: Soft. No mass. No tenderness. No hernia. Normal bowel sounds.  Scars look okay. Extremities:  Good strength and ROM  in upper and lower extremities. Neurologic:  Grossly intact to motor and sensory function.  DATA REVIEWED: Hgb - 7.5 - 03/07/2012 ----> 14.7 - 06/20/2012 MCV - 67 - 03/07/2012. ----->  87 - 06/20/2012 TSH - 0.248 - 03/07/2012  Ovidio Kin, MD,  [facility] [address]  [address]   [place], [place]    [address] Phone:  [phone] FAX:  [phone]

## 2012-08-26 ENCOUNTER — Ambulatory Visit (INDEPENDENT_AMBULATORY_CARE_PROVIDER_SITE_OTHER): Payer: Self-pay | Admitting: General Surgery

## 2012-09-05 ENCOUNTER — Encounter (INDEPENDENT_AMBULATORY_CARE_PROVIDER_SITE_OTHER): Payer: Self-pay

## 2012-09-09 ENCOUNTER — Telehealth (INDEPENDENT_AMBULATORY_CARE_PROVIDER_SITE_OTHER): Payer: Self-pay | Admitting: General Surgery

## 2012-09-09 NOTE — Telephone Encounter (Signed)
Called received from [facility] stating they contacted the patient to inform her that she has a large outstanding bill .They were willing to set up payments Patients states this is not going to happen.

## 2012-09-13 ENCOUNTER — Encounter (HOSPITAL_COMMUNITY): Payer: 59 | Attending: Oncology | Admitting: Oncology

## 2012-09-13 ENCOUNTER — Encounter (HOSPITAL_COMMUNITY): Payer: Self-pay | Admitting: Oncology

## 2012-09-13 VITALS — BP 120/74 | HR 71 | Temp 97.3°F | Resp 18 | Wt 225.8 lb

## 2012-09-13 DIAGNOSIS — D509 Iron deficiency anemia, unspecified: Secondary | ICD-10-CM | POA: Insufficient documentation

## 2012-09-13 DIAGNOSIS — Z9884 Bariatric surgery status: Secondary | ICD-10-CM

## 2012-09-13 DIAGNOSIS — F988 Other specified behavioral and emotional disorders with onset usually occurring in childhood and adolescence: Secondary | ICD-10-CM

## 2012-09-13 DIAGNOSIS — F329 Major depressive disorder, single episode, unspecified: Secondary | ICD-10-CM

## 2012-09-13 LAB — CBC WITH DIFFERENTIAL/PLATELET
Basophils Absolute: 0.1 10*3/uL (ref 0.0–0.1)
Basophils Relative: 1 % (ref 0–1)
Eosinophils Relative: 5 % (ref 0–5)
Lymphocytes Relative: 27 % (ref 12–46)
MCHC: 33.8 g/dL (ref 30.0–36.0)
MCV: 90.1 fL (ref 78.0–100.0)
Neutro Abs: 4.4 10*3/uL (ref 1.7–7.7)
Platelets: 278 10*3/uL (ref 150–400)
RDW: 12.5 % (ref 11.5–15.5)
WBC: 7.3 10*3/uL (ref 4.0–10.5)

## 2012-09-13 LAB — IRON AND TIBC
Iron: 98 ug/dL (ref 42–135)
UIBC: 226 ug/dL (ref 125–400)

## 2012-09-13 NOTE — Progress Notes (Signed)
[name], NP [address] [place] [address]  Iron deficiency anemia - Plan: acetaminophen (TYLENOL) 325 MG tablet, Naproxen Sodium (ALEVE PO), diphenhydrAMINE (BENADRYL) 25 mg capsule, Multiple Vitamins-Minerals (MULTIVITAMIN PO), SENNOSIDES PO, CBC with Differential, Iron and TIBC, Ferritin, CBC with Differential, Iron and TIBC, Ferritin  CURRENT THERAPY: Ferrous Sulfate 325 mg PO daily.   INTERVAL HISTORY: Jessica Nunez 47 y.o. female returns for  regular  visit for followup of iron deficiency anemia in the setting of gastric bypass surgery in 2006 by Dr. Ezzard Standing.    We first met in November and we had planned on giving her IV Feraheme, however, when was reviewed baseline lab work, her Hgb was within normal limits with an improving Ferritin.  As a result, the IV Feraheme was cancelled and she was encouraged to continue PO ferrous sulfate daily.   I personally reviewed and went over laboratory results with the patient.  Iron studies continue to improve on ferrous sulfate PO.  As a result, she is absorbing the ferrous sulfate which is promising, but on the flip side, we therefore cannot blame her iron deficiency on malabsorption.  As a result, I will copy this note to her GI Physician and ask that she be considered for a camera capsule study to hopefully determine a source of her deficiency.     Jessica Nunez really enjoys the functionality of "My Chart" and as a result she would like to transition her care to [facility] using physicians.  She asked about PCPs and I recommended Dr. Jeanice Lim and Dr. Lodema Hong since they use [facility] at their office.   She brings up the topic of her thyroid.  She explains to me that she read some article about the correlation of nephrotic syndrome, which she experienced as a child, and its correlation with the adrenals and the effect of the thyroid thereby causing a subclinical issues. She asks for a list of endocrinologist, preferably those of [facility].  I printed a list from the [facility] Website.   From our standpoint, her iron studies, in addition to her Hgb, has improved on oral ferrous sulfate.  She was encouraged to continue with this therapy.   Hematologically, she denies any complaints and ROS questioning is negative.   Past Medical History  Diagnosis Date  . Tachycardia   . Fibromyalgia   . Hypothyroid   . Iron deficiency   . Iron deficiency anemia 05/10/2012    has History of gastric bypass, 06/23/2004. and Iron deficiency anemia on her problem list.     has No Known Allergies.  [name] had no medications administered during this visit.  Past Surgical History  Procedure Laterality Date  . Gastric bypass    . Colonoscopy  04/14/12  . Esophagogastroduodenoscopy  04/14/12  . Knee arthroscopy      right  . Lasik    . Back surgery      Denies any headaches, dizziness, double vision, fevers, chills, night sweats, nausea, vomiting, diarrhea, constipation, chest pain, heart palpitations, shortness of breath, blood in stool, black tarry stool, urinary pain, urinary burning, urinary frequency, hematuria.   PHYSICAL EXAMINATION  ECOG PERFORMANCE STATUS: 0 - Asymptomatic  Filed Vitals:   09/13/12 1000  BP: 120/74  Pulse: 71  Temp: 97.3 F (36.3 C)  Resp: 18    GENERAL:alert, no distress, well nourished, well developed, comfortable, cooperative, obese and smiling SKIN: skin color, texture, turgor are normal, no rashes or significant lesions HEAD: Normocephalic, No masses, lesions, tenderness or abnormalities EYES:  normal, Conjunctiva are pink and non-injected EARS: External ears normal OROPHARYNX:mucous membranes are moist  NECK: supple, no adenopathy, thyroid normal size, non-tender, without nodularity, no stridor, non-tender LYMPH:  no palpable lymphadenopathy BREAST:not examined LUNGS: clear to auscultation and percussion HEART: regular rate & rhythm, no murmurs, no gallops, S1 normal and S2 normal ABDOMEN:obese and normal bowel  sounds BACK: Back symmetric, no curvature., No CVA tenderness EXTREMITIES:less then 2 second capillary refill, no joint deformities, effusion, or inflammation, no edema, no skin discoloration, no clubbing, no cyanosis  NEURO: alert & oriented x 3 with fluent speech, no focal motor/sensory deficits, gait normal   LABORATORY DATA: CBC    Component Value Date/Time   WBC 7.7 06/20/2012 0835   RBC 5.15* 06/20/2012 0835   HGB 14.7 06/20/2012 0835   HCT 45.1 06/20/2012 0835   PLT 290 06/20/2012 0835   MCV 87.6 06/20/2012 0835   MCH 28.5 06/20/2012 0835   MCHC 32.6 06/20/2012 0835   RDW 15.7* 06/20/2012 0835   LYMPHSABS 2.9 06/20/2012 0835   MONOABS 0.7 06/20/2012 0835   EOSABS 0.5 06/20/2012 0835   BASOSABS 0.1 06/20/2012 0835    Lab Results  Component Value Date   IRON 141* 06/20/2012   TIBC 339 06/20/2012   FERRITIN 31 06/20/2012      ASSESSMENT:  1. Iron Deficiency Anemia, negative colonoscopy and EGD. Camera capsule study not performed.  Improving on ferrous sulfate 325 mg PO daily.  2. Gastric Bypass in 2006 by Dr. Ezzard Standing.  3. ADD  4. History of heart palpitations  5. Depression  6. Obesity  7. Tobacco abuse, H/O 1 ppd x 25 years quitting in 2011 or 2012, now back to smoking 1 pack per week.   PLAN:  1. I personally reviewed and went over laboratory results with the patient. 2. Labs today, 3 months and 6 months: CBC diff, Iron/TIBC, Ferritin 3. Recommended PCPs 4. Recommended endocrinologists 5. Will send a copy of today's note to Dr. Evette Cristal (GI) for consideration of camera capsule study  6. Return in 6 months for follow-up.    All questions were answered. The patient knows to call the clinic with any problems, questions or concerns. We can certainly see the patient much sooner if necessary.  The patient and plan discussed with Glenford Peers, MD and he is in agreement with the aforementioned.   KEFALAS,THOMAS

## 2012-09-13 NOTE — Patient Instructions (Addendum)
[facility] Discharge Instructions  RECOMMENDATIONS MADE BY THE CONSULTANT AND ANY TEST RESULTS WILL BE SENT TO YOUR REFERRING PHYSICIAN.  MEDICATIONS PRESCRIBED:  None  INSTRUCTIONS GIVEN AND DISCUSSED: Continue PO Ferrous Sulfate daily  SPECIAL INSTRUCTIONS/FOLLOW-UP: Labs today to evaluate iron stores. Labs in 3 months and 6 months to evaluate iron. Return in 6 months for follow-up Dr. Jeanice Lim and Dr. Lodema Hong are both primary care physicians who utilize [facility]). List of [facility] Endocrinologist  Thank you for choosing [facility] to provide your oncology and hematology care.  To afford each patient quality time with our providers, please arrive at least 15 minutes before your scheduled appointment time.  With your help, our goal is to use those 15 minutes to complete the necessary work-up to ensure our physicians have the information they need to help with your evaluation and healthcare recommendations.    Effective January 1st, 2014, we ask that you re-schedule your appointment with our physicians should you arrive 10 or more minutes late for your appointment.  We strive to give you quality time with our providers, and arriving late affects you and other patients whose appointments are after yours.    Again, thank you for choosing [facility].  Our hope is that these requests will decrease the amount of time that you wait before being seen by our physicians.       _____________________________________________________________  Should you have questions after your visit to [facility], please contact our office at [phone] between the hours of 8:30 a.m. and 5:00 p.m.  Voicemails left after 4:30 p.m. will not be returned until the following business day.  For prescription refill requests, have your pharmacy contact our office with your prescription refill request.

## 2012-09-27 ENCOUNTER — Encounter (INDEPENDENT_AMBULATORY_CARE_PROVIDER_SITE_OTHER): Payer: Self-pay

## 2012-09-28 ENCOUNTER — Ambulatory Visit: Payer: 59 | Admitting: *Deleted

## 2012-11-17 ENCOUNTER — Encounter: Payer: 59 | Attending: Surgery | Admitting: *Deleted

## 2012-11-17 ENCOUNTER — Encounter: Payer: Self-pay | Admitting: *Deleted

## 2012-11-17 DIAGNOSIS — Z713 Dietary counseling and surveillance: Secondary | ICD-10-CM | POA: Insufficient documentation

## 2012-11-17 DIAGNOSIS — Z9884 Bariatric surgery status: Secondary | ICD-10-CM | POA: Insufficient documentation

## 2012-11-17 DIAGNOSIS — Z09 Encounter for follow-up examination after completed treatment for conditions other than malignant neoplasm: Secondary | ICD-10-CM | POA: Insufficient documentation

## 2012-11-17 NOTE — Patient Instructions (Addendum)
Goals:  Eat 3-6 small meals/snacks, every 3-5 hrs - AVOID MEAL SKIPPING!!  Increase lean protein foods to meet 60-80g goal  Increase fluid intake to 64oz +  Add 15 grams of carbohydrate (fruit, whole grain) with meals  Limit/avoid sweets, diet soda, and sugary drinks  Avoid drinking 15 minutes before, during and 30 minutes after eating  Aim for >30 min of physical activity daily  Resume vitamin/mineral supplementation  Contact:  Redmond Pulling [id number] [facility]

## 2012-11-17 NOTE — Progress Notes (Signed)
  Follow-up visit:  8.5 Years Post-Operative RYGB Surgery  Medical Nutrition Therapy:  Appt start time: 0800   End time:  0900.  Primary concerns today: Post-operative Bariatric Surgery Nutrition Management. Jessica Nunez comes today for nutrition refresher. RYGB in 2006 and reports she hovered between 162-175 lbs until 2011. Started thyroid meds in 2012 and reports gaining 10 lbs every 2 weeks. Has struggled with severe anemia and pica (ate 10 lb bag of ice every night) for the last 8 months. Now resolved. Reports she is very careful with intake. Eats small portions, avoids trigger foods like bread, etc and is very active. States her weight gain was d/t titration of thyroid meds. Saw [facility] for thyroid, but asked for another endo referral. Thinks her adrenal gland is the problem (as documented in previous chart notes). Referred to [facility] @ [facility]. Smokes 1 pack of cigarettes/week.   Surgery date: 06/23/04 Surgery type: RYGB Start weight:  232 lbs  Weight today:  221.5 lbs Weight change:  10.5 lbs Total weight lost:  10.5 lbs  TANITA  BODY COMP RESULTS  11/17/12   BMI (kg/m^2) 38.0   Fat Mass (lbs) 111.0   Fat Free Mass (lbs) 110.5   Total Body Water (lbs) 81.0   24-hr recall:  B: NONE S: NONE L: Protein shake (Atkins) - 15g S: NONE D: "a normal dinner" - ~15-20g (?) S:  10 lb bag of ice (previously)  Fluid intake:  Hot tea, green tea (12 oz * 4-5 cups) = 50-60 oz Estimated total protein intake: < 60 g  Medications: See medication list Supplementation: Taking Iron and Vit D; No MVI, calcium, B12 ("for a while") Labs:  CBC, Iron panel, ferritin WNL; Vit D (H), TSH (L) as of 09/18/12 - no B12 or other nutrient labs drawn. Recommend full nutrient panel.   Drinking while eating: Sips only Hair loss: No Carbonated beverages:  Yes; Diet Mtn Dew (6 oz 2-3 times/wk) N/V/D/C: Does not tolerate rice, large amounts of sweets, 1/4 lg banana, watermelon Dumping syndrome: None  lately  Recent physical activity:  Very active; kayaking, riding bikes, "27 flights of stairs a day at work"  Progress Towards Goal(s):  In progress.  Handouts given during visit include:  Bariatric Surgery Specialized Post-Op Diet  Samples given during visit include:   Premier Protein shake: 2 bottles Lot: [id number]; Exp: 06/27/13   Unjury Protein Powder - 2 pkts ea of the following:  Lot: [id number]; Exp: 09/15 Lot: [id number]; Exp: 09/15 Lot: [id number]; Exp: 09/15   Nutritional Diagnosis:  NI-5.7.1 Inadequate protein intake related to decreased PO intake as evidenced by patient reported food recall and consumption of <50% of post-op bariatric surgery needs.    Intervention:  Nutrition education/reinforcement.  Monitoring/Evaluation:  Dietary intake, exercise, and body weight. Follow up in 2 weeks (per pt request).

## 2012-12-07 ENCOUNTER — Encounter (INDEPENDENT_AMBULATORY_CARE_PROVIDER_SITE_OTHER): Payer: Self-pay | Admitting: Surgery

## 2012-12-09 ENCOUNTER — Encounter: Payer: Self-pay | Admitting: *Deleted

## 2012-12-09 ENCOUNTER — Encounter: Payer: 59 | Admitting: *Deleted

## 2012-12-09 NOTE — Patient Instructions (Addendum)
Goals:  Increase lean protein foods to meet 60-80g goal  Increase fluid intake to 64oz +  Add 15 grams of carbohydrate (fruit, whole grain) with meals  Limit/avoid sweets, diet soda, and sugary drinks  Avoid drinking 15 minutes before, during and 30 minutes after eating  Resume vitamin/mineral supplementation  Contact Dr. Ezzard Standing if stomach pain/burning persists    Contact:  Redmond Pulling [id number] [facility]

## 2012-12-09 NOTE — Progress Notes (Signed)
  Follow-up visit:  8.5 Years Post-Operative RYGB Surgery  Medical Nutrition Therapy:  Appt start time: 0915  End time:  0945.  Primary concerns today: Post-operative Bariatric Surgery Nutrition Management. Reports no longer skipping meals. Feels like her pouch is "shrinking" and reports that belly has been "on fire" and has been burping more. ? If possible ulcer? Not taking any Prilosec at this time. She also reports "cheating" once. Still struggles with emotional eating, but has made good strides since last visit.   Surgery date: 06/23/04 Surgery type: RYGB Start weight:  232 lbs  Weight today:  220.0 lbs Weight change:  1.5 lbs Total weight lost:  12.0 lbs  Goal weight: 165-170 lbs % goal met:   TANITA  BODY COMP RESULTS  11/17/12 12/09/12   BMI (kg/m^2) 38.0 37.8   Fat Mass (lbs) 111.0 108.0   Fat Free Mass (lbs) 110.5 112.0   Total Body Water (lbs) 81.0 82.0   24-hr recall:  B: Protein shake (Unjury w/ Sunny D - 8 oz) - 15g S: Dannon L&F greek yogurt w/ Grape-Nuts Fit (1 oz) L:  5 oz tuna OR salad and (3 oz) grilled chicken with raspberry walnut dressing S: Protein shake (Atkins) - 15g D: "a normal dinner" - ~15-20g (?) S:  10 lb bag of ice (previously)  Fluid intake:  Hot tea, green tea (12 oz * 4-5 cups), 8-16 oz protein shakes = 65-80 oz Estimated total protein intake: < 60 g  Medications: No changes reported Supplementation: Taking Iron and Vit D; No MVI, calcium, B12 ("for a while") Labs:  Reports no labs drawn as of time of visit.    Drinking while eating: Sips only Hair loss: No Carbonated beverages:  Yes; Diet Mtn Dew (12 oz over the day @ 2-3 times/wk) N/V/D/C: Reports severe constipation last week after eating only salads; resolving after she switched back to protein shakes/greek yogurt w/ grains. Does not tolerate rice, large amounts of sweets, 1/4 lg banana, watermelon Dumping syndrome: None lately  Recent physical activity:  Very active; kayaking, riding  bikes, "27 flights of stairs a day at work"  Progress Towards Goal(s):  In progress.  Nutritional Diagnosis:  NI-5.7.1 Inadequate protein intake related to decreased PO intake as evidenced by patient reported food recall and consumption of <50% of post-op bariatric surgery needs.    Intervention:  Nutrition education/reinforcement.  Samples given during visit include:   Unjury Protein Powder: 2 pkts ea Lot: [id number]; Exp: 09/15 Lot: [id number]; Exp: 07/15  Monitoring/Evaluation:  Dietary intake, exercise, and body weight. Follow up in 2 weeks (per pt request).

## 2012-12-13 ENCOUNTER — Other Ambulatory Visit (HOSPITAL_COMMUNITY): Payer: 59

## 2013-01-02 ENCOUNTER — Encounter: Payer: 59 | Attending: Surgery | Admitting: *Deleted

## 2013-01-02 DIAGNOSIS — Z09 Encounter for follow-up examination after completed treatment for conditions other than malignant neoplasm: Secondary | ICD-10-CM | POA: Insufficient documentation

## 2013-01-02 DIAGNOSIS — Z713 Dietary counseling and surveillance: Secondary | ICD-10-CM | POA: Insufficient documentation

## 2013-01-02 DIAGNOSIS — Z9884 Bariatric surgery status: Secondary | ICD-10-CM | POA: Insufficient documentation

## 2013-01-02 NOTE — Progress Notes (Signed)
  Follow-up visit:  8.5 Years Post-Operative RYGB Surgery  Medical Nutrition Therapy:  Appt start time: 0915  End time:  0945.  Primary concerns today: Post-operative Bariatric Surgery Nutrition Management. Reports she is trying to do more meals instead of "short cuts" (ex: shakes, protein bars, etc). Has decreased soda intake and continues to watch CHO intake and portions. Reports Dr. Talmage Nap dropped thyroid med to 6 days/week the beginning of June 2014 and she is not losing weight. Thinks this is the reason. May also be peri-menopause.  Also reports fluid retention in legs and feet and severe fatigue. Burping and abdominal pain resolved now that she is taking Prilosec daily.   Surgery date: 06/23/04 Surgery type: RYGB Start weight:  232 lbs  Weight today:  223.5 lbs Weight change:  3.5 lbs (GAIN) - However, LOST 1.0 LB OF FAT MASS Total weight lost:  8.5 lbs  Goal weight: 165-170 lbs % goal met:  13-14%  TANITA  BODY COMP RESULTS  11/17/12 12/09/12 01/03/12   BMI (kg/m^2) 38.0 37.8 38.4   Fat Mass (lbs) 111.0 108.0 107.0   Fat Free Mass (lbs) 110.5 112.0 116.5   Total Body Water (lbs) 81.0 82.0 85.5   Dietary Intake:  8-16 oz protein shakes  5 oz tuna at lunch 3 oz chicken or tuna at dinner  Fluid intake:  Hot tea, coffee, 20 oz diet soda a week, green tea (12 oz * 4-5 cups), 8-16 oz protein shakes = 65-80 oz (of non-caffeinated) Estimated total protein intake:  ~ 60 g  Medications: No changes reported Supplementation: Taking Iron, Vit D, MVI, calcium.  No B12 Labs:  Reports no labs drawn as of time of visit.    Drinking while eating: Sips only; trying to d/c Hair loss: No Carbonated beverages:  Yes; Diet Mtn Dew (12 oz over the day @ 1 times/wk) - DECREASED FROM 2-3 PER WEEK!! N/V/D/C: Reports constipation has resolved. Does not tolerate rice, large amounts of sweets, 1/4 lg banana, watermelon, and corn Dumping syndrome: None  Recent physical activity:  Very active; kayaking,  riding bikes, "27 flights of stairs a day at work"  Progress Towards Goal(s):  In progress.  Nutritional Diagnosis:  [place]-3.3 Overweight/obesity related to past poor dietary habits and physical inactivity as evidenced by patient 8.5 yrs s/p RYGB surgery following dietary guidelines for continued weight loss.  NI-5.7.1 Inadequate protein intake related to decreased PO intake as evidenced by patient reported food recall and consumption of <50% of post-op bariatric surgery needs.    Intervention:  Nutrition education/reinforcement.  Monitoring/Evaluation:  Dietary intake, exercise, and body weight. Follow up in 2 weeks (per pt request).

## 2013-01-02 NOTE — Patient Instructions (Addendum)
Goals:  Continue intake of lean protein foods to meet 60-80g goal  Add 15 grams of carbohydrate (fruit, whole grain) with meals  Limit/avoid sweets, diet soda, and sugary drinks  Avoid drinking 15 minutes before, during and 30 minutes after eating  Resume B12 supplementation  Contact Dr. Talmage Nap if severe fatigue continues from Armour dose change.    Contact:  Redmond Pulling [id number] [facility]

## 2013-03-15 ENCOUNTER — Other Ambulatory Visit (HOSPITAL_COMMUNITY): Payer: 59

## 2013-03-17 ENCOUNTER — Ambulatory Visit (HOSPITAL_COMMUNITY): Payer: 59

## 2013-03-17 ENCOUNTER — Encounter (HOSPITAL_COMMUNITY): Payer: Self-pay

## 2013-03-18 NOTE — Progress Notes (Signed)
This encounter was created in error - please disregard. This encounter was created in error - please disregard. This encounter was created in error - please disregard. 

## 2013-04-20 ENCOUNTER — Other Ambulatory Visit: Payer: Self-pay

## 2013-09-18 ENCOUNTER — Ambulatory Visit: Payer: Self-pay | Admitting: Specialist

## 2013-09-20 ENCOUNTER — Ambulatory Visit: Payer: Self-pay | Admitting: Specialist

## 2013-10-16 ENCOUNTER — Ambulatory Visit: Payer: Self-pay | Admitting: Specialist

## 2013-11-13 ENCOUNTER — Ambulatory Visit: Payer: Self-pay | Admitting: Specialist

## 2014-05-03 DIAGNOSIS — E668 Other obesity: Secondary | ICD-10-CM | POA: Insufficient documentation

## 2014-05-03 DIAGNOSIS — E669 Obesity, unspecified: Secondary | ICD-10-CM | POA: Insufficient documentation

## 2014-08-06 ENCOUNTER — Ambulatory Visit: Payer: Self-pay | Admitting: Physician Assistant

## 2014-10-16 DIAGNOSIS — M797 Fibromyalgia: Secondary | ICD-10-CM | POA: Insufficient documentation

## 2014-10-16 DIAGNOSIS — F332 Major depressive disorder, recurrent severe without psychotic features: Secondary | ICD-10-CM

## 2014-10-16 DIAGNOSIS — Z780 Asymptomatic menopausal state: Secondary | ICD-10-CM | POA: Insufficient documentation

## 2014-10-16 DIAGNOSIS — R5382 Chronic fatigue, unspecified: Secondary | ICD-10-CM | POA: Insufficient documentation

## 2014-10-16 DIAGNOSIS — F909 Attention-deficit hyperactivity disorder, unspecified type: Secondary | ICD-10-CM | POA: Insufficient documentation

## 2014-10-16 DIAGNOSIS — F329 Major depressive disorder, single episode, unspecified: Secondary | ICD-10-CM | POA: Insufficient documentation

## 2014-10-16 DIAGNOSIS — E039 Hypothyroidism, unspecified: Secondary | ICD-10-CM | POA: Insufficient documentation

## 2014-10-16 DIAGNOSIS — Z720 Tobacco use: Secondary | ICD-10-CM | POA: Insufficient documentation

## 2014-11-26 ENCOUNTER — Other Ambulatory Visit: Payer: Self-pay | Admitting: Unknown Physician Specialty

## 2014-12-01 ENCOUNTER — Other Ambulatory Visit: Payer: Self-pay | Admitting: Unknown Physician Specialty

## 2014-12-05 ENCOUNTER — Encounter: Payer: Self-pay | Admitting: Unknown Physician Specialty

## 2014-12-05 ENCOUNTER — Ambulatory Visit (INDEPENDENT_AMBULATORY_CARE_PROVIDER_SITE_OTHER): Payer: BLUE CROSS/BLUE SHIELD | Admitting: Unknown Physician Specialty

## 2014-12-05 VITALS — BP 171/110 | HR 64 | Temp 97.9°F | Ht 63.8 in | Wt 167.2 lb

## 2014-12-05 DIAGNOSIS — L989 Disorder of the skin and subcutaneous tissue, unspecified: Secondary | ICD-10-CM

## 2014-12-05 DIAGNOSIS — R5382 Chronic fatigue, unspecified: Secondary | ICD-10-CM

## 2014-12-05 DIAGNOSIS — I10 Essential (primary) hypertension: Secondary | ICD-10-CM | POA: Insufficient documentation

## 2014-12-05 DIAGNOSIS — F332 Major depressive disorder, recurrent severe without psychotic features: Secondary | ICD-10-CM

## 2014-12-05 LAB — UA/M W/RFLX CULTURE, ROUTINE
Bilirubin, UA: NEGATIVE
Glucose, UA: NEGATIVE
Ketones, UA: NEGATIVE
Leukocytes, UA: NEGATIVE
Nitrite, UA: NEGATIVE
Protein, UA: NEGATIVE
RBC, UA: NEGATIVE
SPEC GRAV UA: 1.025 (ref 1.005–1.030)
Urobilinogen, Ur: 0.2 mg/dL (ref 0.2–1.0)
pH, UA: 6.5 (ref 5.0–7.5)

## 2014-12-05 MED ORDER — AMLODIPINE BESYLATE 5 MG PO TABS: 5.0000 mg | ORAL_TABLET | Freq: Every day | ORAL | Status: DC

## 2014-12-05 MED ORDER — THYROID 60 MG PO TABS: 60.0000 mg | ORAL_TABLET | Freq: Every day | ORAL | Status: DC

## 2014-12-05 NOTE — Assessment & Plan Note (Signed)
New problem.  R/o secondary causes.  Will check CMP, TSH, UA.  Consider kidney ultrasound

## 2014-12-05 NOTE — Addendum Note (Signed)
Addended by: [name] on: 12/05/2014 09:09 AM   Modules accepted: Orders

## 2014-12-05 NOTE — Assessment & Plan Note (Signed)
Has been on Armor for off label use.  Will decrease to 60 mg due to high BP and discuss weaning off.

## 2014-12-05 NOTE — Assessment & Plan Note (Signed)
Stable on current treatment 

## 2014-12-05 NOTE — Progress Notes (Addendum)
BP 171/110 mmHg  Pulse 64  Temp(Src) 97.9 F (36.6 C)  Ht 5' 3.8" (1.621 m)  Wt 167 lb 3.2 oz (75.841 kg)  BMI 28.86 kg/m2  SpO2 97%  LMP 04/05/2014 (Approximate)   Subjective:    Patient ID: Jessica Nunez, female    DOB: 1965/12/01, 49 y.o.   MRN: [id number]  HPI: Jessica Nunez is a 49 y.o. female  Chief Complaint  Patient presents with  . Depression   Depression is stable.  PHQ 2 is 0.  She lost 70 pounds, partly related to 2 gall bladder surgeries.    Relevant past medical, surgical, family and social history reviewed and updated as indicated. Interim medical history since our last visit reviewed. Allergies and medications reviewed and updated.  Hypertension This is a new problem. The current episode started more than 1 month ago. The problem has been gradually worsening since onset. The problem is uncontrolled. Pertinent negatives include no anxiety, blurred vision, chest pain, headaches, malaise/fatigue, neck pain, orthopnea, palpitations, peripheral edema, PND, shortness of breath or sweats. Associated agents: Armor Thyroid. Treatments tried: Taking beta blocker for tachycardia history.   Skin lesions pt describing "rapidly growing" lesions on face that now "hurt."   Review of Systems  Constitutional: Negative for malaise/fatigue.  Eyes: Negative for blurred vision.  Respiratory: Negative for shortness of breath.   Cardiovascular: Negative for chest pain, palpitations, orthopnea and PND.  Musculoskeletal: Negative for neck pain.  Neurological: Negative for headaches.    Per HPI unless specifically indicated above     Objective:    BP 171/110 mmHg  Pulse 64  Temp(Src) 97.9 F (36.6 C)  Ht 5' 3.8" (1.621 m)  Wt 167 lb 3.2 oz (75.841 kg)  BMI 28.86 kg/m2  SpO2 97%  LMP 04/05/2014 (Approximate)  Wt Readings from Last 3 Encounters:  12/05/14 167 lb 3.2 oz (75.841 kg)  06/04/14 211 lb (95.709 kg)  12/09/12 220 lb (99.791 kg)    Physical Exam    Constitutional: She is oriented to person, place, and time. She appears well-developed and well-nourished. No distress.  HENT:  Head: Normocephalic and atraumatic.  Eyes: Conjunctivae and lids are normal. Right eye exhibits no discharge. Left eye exhibits no discharge. No scleral icterus.  Neck: Normal carotid pulses, no hepatojugular reflux and no JVD present. Carotid bruit is not present. Thyromegaly present. No thyroid mass present.  Cardiovascular: Normal rate and regular rhythm.   Pulmonary/Chest: Effort normal. No respiratory distress.  Abdominal: Soft. Normal appearance and bowel sounds are normal. There is no splenomegaly or hepatomegaly. There is no tenderness.    Musculoskeletal: Normal range of motion.  Neurological: She is alert and oriented to person, place, and time.  Skin: Skin is warm, dry and intact. No rash noted. No pallor.  2 scaley and erythematous  lesions on face about 1 cm  Psychiatric: She has a normal mood and affect. Her behavior is normal. Judgment and thought content normal.       Assessment & Plan:   Problem List Items Addressed This Visit      Cardiovascular and Mediastinum   Essential hypertension, benign    New problem.  R/o secondary causes.  Will check CMP, TSH, UA.  Consider kidney ultrasound      Relevant Medications   amLODipine (NORVASC) 5 MG tablet   Other Relevant Orders   Comprehensive metabolic panel   TSH   UA/M w/rflx Culture, Routine   Lipid Panel w/o Chol/HDL Ratio  Other   Major depression    Stable on current treatment      Chronic fatigue - Primary    Has been on Armor for off label use.  Will decrease to 60 mg due to high BP and discuss weaning off.        Relevant Orders   HIV antibody    Other Visit Diagnoses    Skin lesions        Refer to dermatology as they are on her face    Relevant Orders    Ambulatory referral to Dermatology       Follow up plan: Return in about 5 weeks (around 01/09/2015) for  follow up.

## 2014-12-05 NOTE — Patient Instructions (Signed)
DASH Eating Plan °DASH stands for "Dietary Approaches to Stop Hypertension." The DASH eating plan is a healthy eating plan that has been shown to reduce high blood pressure (hypertension). Additional health benefits may include reducing the risk of type 2 diabetes mellitus, heart disease, and stroke. The DASH eating plan may also help with weight loss. °WHAT DO I NEED TO KNOW ABOUT THE DASH EATING PLAN? °For the DASH eating plan, you will follow these general guidelines: °· Choose foods with a percent daily value for sodium of less than 5% (as listed on the food label). °· Use salt-free seasonings or herbs instead of table salt or sea salt. °· Check with your health care provider or pharmacist before using salt substitutes. °· Eat lower-sodium products, often labeled as "lower sodium" or "no salt added." °· Eat fresh foods. °· Eat more vegetables, fruits, and low-fat dairy products. °· Choose whole grains. Look for the word "whole" as the first word in the ingredient list. °· Choose fish and skinless chicken or turkey more often than red meat. Limit fish, poultry, and meat to 6 oz (170 g) each day. °· Limit sweets, desserts, sugars, and sugary drinks. °· Choose heart-healthy fats. °· Limit cheese to 1 oz (28 g) per day. °· Eat more home-cooked food and less restaurant, buffet, and fast food. °· Limit fried foods. °· Cook foods using methods other than frying. °· Limit canned vegetables. If you do use them, rinse them well to decrease the sodium. °· When eating at a restaurant, ask that your food be prepared with less salt, or no salt if possible. °WHAT FOODS CAN I EAT? °Seek help from a dietitian for individual calorie needs. °Grains °Whole grain or whole wheat bread. Brown rice. Whole grain or whole wheat pasta. Quinoa, bulgur, and whole grain cereals. Low-sodium cereals. Corn or whole wheat flour tortillas. Whole grain cornbread. Whole grain crackers. Low-sodium crackers. °Vegetables °Fresh or frozen vegetables  (raw, steamed, roasted, or grilled). Low-sodium or reduced-sodium tomato and vegetable juices. Low-sodium or reduced-sodium tomato sauce and paste. Low-sodium or reduced-sodium canned vegetables.  °Fruits °All fresh, canned (in natural juice), or frozen fruits. °Meat and Other Protein Products °Ground beef (85% or leaner), grass-fed beef, or beef trimmed of fat. Skinless chicken or turkey. Ground chicken or turkey. Pork trimmed of fat. All fish and seafood. Eggs. Dried beans, peas, or lentils. Unsalted nuts and seeds. Unsalted canned beans. °Dairy °Low-fat dairy products, such as skim or 1% milk, 2% or reduced-fat cheeses, low-fat ricotta or cottage cheese, or plain low-fat yogurt. Low-sodium or reduced-sodium cheeses. °Fats and Oils °Tub margarines without trans fats. Light or reduced-fat mayonnaise and salad dressings (reduced sodium). Avocado. Safflower, olive, or canola oils. Natural peanut or almond butter. °Other °Unsalted popcorn and pretzels. °The items listed above may not be a complete list of recommended foods or beverages. Contact your dietitian for more options. °WHAT FOODS ARE NOT RECOMMENDED? °Grains °White bread. White pasta. White rice. Refined cornbread. Bagels and croissants. Crackers that contain trans fat. °Vegetables °Creamed or fried vegetables. Vegetables in a cheese sauce. Regular canned vegetables. Regular canned tomato sauce and paste. Regular tomato and vegetable juices. °Fruits °Dried fruits. Canned fruit in light or heavy syrup. Fruit juice. °Meat and Other Protein Products °Fatty cuts of meat. Ribs, chicken wings, bacon, sausage, bologna, salami, chitterlings, fatback, hot dogs, bratwurst, and packaged luncheon meats. Salted nuts and seeds. Canned beans with salt. °Dairy °Whole or 2% milk, cream, half-and-half, and cream cheese. Whole-fat or sweetened yogurt. Full-fat   cheeses or blue cheese. Nondairy creamers and whipped toppings. Processed cheese, cheese spreads, or cheese  curds. °Condiments °Onion and garlic salt, seasoned salt, table salt, and sea salt. Canned and packaged gravies. Worcestershire sauce. Tartar sauce. Barbecue sauce. Teriyaki sauce. Soy sauce, including reduced sodium. Steak sauce. Fish sauce. Oyster sauce. Cocktail sauce. Horseradish. Ketchup and mustard. Meat flavorings and tenderizers. Bouillon cubes. Hot sauce. Tabasco sauce. Marinades. Taco seasonings. Relishes. °Fats and Oils °Butter, stick margarine, lard, shortening, ghee, and bacon fat. Coconut, palm kernel, or palm oils. Regular salad dressings. °Other °Pickles and olives. Salted popcorn and pretzels. °The items listed above may not be a complete list of foods and beverages to avoid. Contact your dietitian for more information. °WHERE CAN I FIND MORE INFORMATION? °National Heart, Lung, and [employer] Institute: www.nhlbi.nih.gov/health/health-topics/topics/dash/ °Document Released: 05/21/2011 Document Revised: 10/16/2013 Document Reviewed: 04/05/2013 °ExitCare® Patient Information ©2015 [employer]. This information is not intended to replace advice given to you by your health care provider. Make sure you discuss any questions you have with your health care provider. ° °

## 2014-12-06 LAB — COMPREHENSIVE METABOLIC PANEL
ALBUMIN: 3.7 g/dL (ref 3.5–5.5)
ALT: 100 IU/L — ABNORMAL HIGH (ref 0–32)
AST: 86 IU/L — AB (ref 0–40)
Albumin/Globulin Ratio: 1.4 (ref 1.1–2.5)
Alkaline Phosphatase: 168 IU/L — ABNORMAL HIGH (ref 39–117)
BUN / CREAT RATIO: 13 (ref 9–23)
BUN: 7 mg/dL (ref 6–24)
Bilirubin Total: 0.3 mg/dL (ref 0.0–1.2)
CO2: 25 mmol/L (ref 18–29)
CREATININE: 0.55 mg/dL — AB (ref 0.57–1.00)
Calcium: 8.7 mg/dL (ref 8.7–10.2)
Chloride: 103 mmol/L (ref 97–108)
GFR, EST AFRICAN AMERICAN: 127 mL/min/{1.73_m2} (ref >59)
GFR, EST NON AFRICAN AMERICAN: 110 mL/min/{1.73_m2} (ref >59)
Globulin, Total: 2.6 g/dL (ref 1.5–4.5)
Glucose: 74 mg/dL (ref 65–99)
Potassium: 3.9 mmol/L (ref 3.5–5.2)
SODIUM: 142 mmol/L (ref 134–144)
Total Protein: 6.3 g/dL (ref 6.0–8.5)

## 2014-12-06 LAB — LIPID PANEL W/O CHOL/HDL RATIO
Cholesterol, Total: 112 mg/dL (ref 100–199)
HDL: 35 mg/dL — AB (ref >39)
LDL CALC: 63 mg/dL (ref 0–99)
TRIGLYCERIDES: 71 mg/dL (ref 0–149)
VLDL CHOLESTEROL CAL: 14 mg/dL (ref 5–40)

## 2014-12-06 LAB — HIV ANTIBODY (ROUTINE TESTING W REFLEX): HIV Screen 4th Generation wRfx: NONREACTIVE

## 2014-12-06 LAB — TSH: TSH: 0.978 u[IU]/mL (ref 0.450–4.500)

## 2014-12-31 ENCOUNTER — Other Ambulatory Visit: Payer: Self-pay | Admitting: Unknown Physician Specialty

## 2015-01-09 ENCOUNTER — Encounter: Payer: Self-pay | Admitting: Unknown Physician Specialty

## 2015-01-09 ENCOUNTER — Ambulatory Visit (INDEPENDENT_AMBULATORY_CARE_PROVIDER_SITE_OTHER): Payer: BLUE CROSS/BLUE SHIELD | Admitting: Unknown Physician Specialty

## 2015-01-09 VITALS — BP 129/83 | HR 67 | Temp 97.9°F | Ht 63.5 in | Wt 170.2 lb

## 2015-01-09 DIAGNOSIS — M7070 Other bursitis of hip, unspecified hip: Secondary | ICD-10-CM | POA: Diagnosis not present

## 2015-01-09 DIAGNOSIS — I10 Essential (primary) hypertension: Secondary | ICD-10-CM | POA: Diagnosis not present

## 2015-01-09 DIAGNOSIS — G4700 Insomnia, unspecified: Secondary | ICD-10-CM

## 2015-01-09 DIAGNOSIS — R748 Abnormal levels of other serum enzymes: Secondary | ICD-10-CM

## 2015-01-09 NOTE — Assessment & Plan Note (Signed)
Improved.  Continue present medications 

## 2015-01-09 NOTE — Progress Notes (Signed)
BP 129/83 mmHg  Pulse 67  Temp(Src) 97.9 F (36.6 C)  Ht 5' 3.5" (1.613 m)  Wt 170 lb 3.2 oz (77.202 kg)  BMI 29.67 kg/m2  SpO2 97%  LMP 04/05/2014 (Approximate)   Subjective:    Patient ID: Jessica Nunez, female    DOB: May 09, 1966, 49 y.o.   MRN: [id number]  HPI: Jessica Nunez is a 49 y.o. female  Chief Complaint  Patient presents with  . Hypertension   Hypertension F/u on BP and to possibly adjust medications.  Last visit we decreased Armor that she is using off label last visit.  She feels good today and her weight has "leveled out."  Her BP is good today with numbers of 120's-130's/80's-90's.    Fatigue/Fibromyalgia This is leveling out.  Admits to be more active and is trying to walk and exercise more.  States her hips ache and wake her up in the middle of the night.  She is complaining of "hip pain" and sleeps with a pillow between her knees.  She is also sleep eating when she takes Ambien.    Elevated liver enzymes Recheck today.  Probably related to multiple surgeries  Relevant past medical, surgical, family and social history reviewed and updated as indicated. Interim medical history since our last visit reviewed. Allergies and medications reviewed and updated.  Review of Systems  Per HPI unless specifically indicated above     Objective:    BP 129/83 mmHg  Pulse 67  Temp(Src) 97.9 F (36.6 C)  Ht 5' 3.5" (1.613 m)  Wt 170 lb 3.2 oz (77.202 kg)  BMI 29.67 kg/m2  SpO2 97%  LMP 04/05/2014 (Approximate)  Wt Readings from Last 3 Encounters:  01/09/15 170 lb 3.2 oz (77.202 kg)  12/05/14 167 lb 3.2 oz (75.841 kg)  06/04/14 211 lb (95.709 kg)    Physical Exam  Constitutional: She is oriented to person, place, and time. She appears well-developed and well-nourished. No distress.  HENT:  Head: Normocephalic and atraumatic.  Eyes: Conjunctivae and lids are normal. Right eye exhibits no discharge. Left eye exhibits no discharge. No scleral icterus.   Cardiovascular: Normal rate, regular rhythm and normal heart sounds.   Pulmonary/Chest: Effort normal and breath sounds normal. No respiratory distress.  Abdominal: Normal appearance. There is no splenomegaly or hepatomegaly.  Musculoskeletal: Normal range of motion.  Neurological: She is alert and oriented to person, place, and time.  Skin: Skin is intact. No rash noted. No pallor.  Psychiatric: She has a normal mood and affect. Her behavior is normal. Judgment and thought content normal.    Results for orders placed or performed in visit on 12/05/14  Comprehensive metabolic panel  Result Value Ref Range   Glucose 74 65 - 99 mg/dL   BUN 7 6 - 24 mg/dL   Creatinine, Ser 4.09 (L) 0.57 - 1.00 mg/dL   GFR calc non Af Amer 110 >59 mL/min/1.73   GFR calc Af Amer 127 >59 mL/min/1.73   BUN/Creatinine Ratio 13 9 - 23   Sodium 142 134 - 144 mmol/L   Potassium 3.9 3.5 - 5.2 mmol/L   Chloride 103 97 - 108 mmol/L   CO2 25 18 - 29 mmol/L   Calcium 8.7 8.7 - 10.2 mg/dL   Total Protein 6.3 6.0 - 8.5 g/dL   Albumin 3.7 3.5 - 5.5 g/dL   Globulin, Total 2.6 1.5 - 4.5 g/dL   Albumin/Globulin Ratio 1.4 1.1 - 2.5   Bilirubin Total 0.3 0.0 -  1.2 mg/dL   Alkaline Phosphatase 168 (H) 39 - 117 IU/L   AST 86 (H) 0 - 40 IU/L   ALT 100 (H) 0 - 32 IU/L  TSH  Result Value Ref Range   TSH 0.978 0.450 - 4.500 uIU/mL  UA/M w/rflx Culture, Routine  Result Value Ref Range   Specific Gravity, UA 1.025 1.005 - 1.030   pH, UA 6.5 5.0 - 7.5   Color, UA Yellow Yellow   Appearance Ur Cloudy (A) Clear   Leukocytes, UA Negative Negative   Protein, UA Negative Negative/Trace   Glucose, UA Negative Negative   Ketones, UA Negative Negative   RBC, UA Negative Negative   Bilirubin, UA Negative Negative   Urobilinogen, Ur 0.2 0.2 - 1.0 mg/dL   Nitrite, UA Negative Negative  Lipid Panel w/o Chol/HDL Ratio  Result Value Ref Range   Cholesterol, Total 112 100 - 199 mg/dL   Triglycerides 71 0 - 149 mg/dL   HDL 35  (L) >78 mg/dL   VLDL Cholesterol Cal 14 5 - 40 mg/dL   LDL Calculated 63 0 - 99 mg/dL  HIV antibody  Result Value Ref Range   HIV Screen 4th Generation wRfx Non Reactive Non Reactive      Assessment & Plan:   Problem List Items Addressed This Visit      Unprioritized   Essential hypertension, benign - Primary    Improved.  Continue present medications      Relevant Orders   Comprehensive metabolic panel    Other Visit Diagnoses    Hip bursitis, unspecified laterality        Recommended stretching and exercises.      Elevated liver enzymes        Check CMP    Relevant Orders    Comprehensive metabolic panel    Insomnia        DC Ambien as is sleep walking        Follow up plan: Return in about 6 months (around 07/12/2015).

## 2015-01-10 ENCOUNTER — Other Ambulatory Visit: Payer: Self-pay | Admitting: Unknown Physician Specialty

## 2015-01-10 LAB — COMPREHENSIVE METABOLIC PANEL
ALT: 29 IU/L (ref 0–32)
AST: 24 IU/L (ref 0–40)
Albumin/Globulin Ratio: 1.2 (ref 1.1–2.5)
Albumin: 3.2 g/dL — ABNORMAL LOW (ref 3.5–5.5)
Alkaline Phosphatase: 132 IU/L — ABNORMAL HIGH (ref 39–117)
BUN/Creatinine Ratio: 16 (ref 9–23)
BUN: 10 mg/dL (ref 6–24)
Bilirubin Total: 0.2 mg/dL (ref 0.0–1.2)
CO2: 25 mmol/L (ref 18–29)
Calcium: 7.9 mg/dL — ABNORMAL LOW (ref 8.7–10.2)
Chloride: 105 mmol/L (ref 97–108)
Creatinine, Ser: 0.63 mg/dL (ref 0.57–1.00)
GFR calc Af Amer: 122 mL/min/{1.73_m2} (ref >59)
GFR calc non Af Amer: 106 mL/min/{1.73_m2} (ref >59)
GLUCOSE: 73 mg/dL (ref 65–99)
Globulin, Total: 2.7 g/dL (ref 1.5–4.5)
POTASSIUM: 4.4 mmol/L (ref 3.5–5.2)
Sodium: 143 mmol/L (ref 134–144)
Total Protein: 5.9 g/dL — ABNORMAL LOW (ref 6.0–8.5)

## 2015-02-16 ENCOUNTER — Other Ambulatory Visit: Payer: Self-pay | Admitting: Unknown Physician Specialty

## 2015-02-24 ENCOUNTER — Encounter (HOSPITAL_COMMUNITY): Payer: Self-pay | Admitting: Emergency Medicine

## 2015-02-24 ENCOUNTER — Emergency Department (INDEPENDENT_AMBULATORY_CARE_PROVIDER_SITE_OTHER)
Admission: EM | Admit: 2015-02-24 | Discharge: 2015-02-24 | Disposition: A | Discharge status: Home / Self Care | Payer: BLUE CROSS/BLUE SHIELD | Source: Home / Self Care | Attending: Family Medicine | Admitting: Family Medicine

## 2015-02-24 ENCOUNTER — Emergency Department (INDEPENDENT_AMBULATORY_CARE_PROVIDER_SITE_OTHER): Payer: BLUE CROSS/BLUE SHIELD

## 2015-02-24 DIAGNOSIS — S8001XA Contusion of right knee, initial encounter: Secondary | ICD-10-CM

## 2015-02-24 DIAGNOSIS — S6391XA Sprain of unspecified part of right wrist and hand, initial encounter: Secondary | ICD-10-CM

## 2015-02-24 NOTE — Discharge Instructions (Signed)
You have a contusion or bruise on your right knee and the right hand is sprain. The x-rays did not show any fracture and because of this, no specific treatment is needed. Usually we use an anti-inflammatory but this is optional. You may want to use ice on the hand and knee intermittently for the next day or so. Both of these should heal over the next week and you should have for use of your legs and hand next weekend. If symptoms persist, please return or see her private physician.

## 2015-02-24 NOTE — ED Provider Notes (Signed)
CSN: [id number]     Arrival date & time 02/24/15  1654 History   First MD Initiated Contact with Patient 02/24/15 1740     No chief complaint on file.  (Consider location/radiation/quality/duration/timing/severity/associated sxs/prior Treatment) The history is provided by the patient.   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 She was at the grocery store yesterday and she fell when miss stepping on the edge of the curb. She bruised her right knee and landed on her right hand hyperextending the lateral 2 fingers. Since the fall, patient's been able to walk with minimal pain but she has noticed increasing swelling on the ulnar side of her dorsal right hand with inability to hyperextend the fingers on that side or completely make a flexed fist.  She did not abrade the skin and there is been no ecchymosis of the hand. She does have a 1-2 inch area of ecchymosis on the anterior lateral joint line of the right knee. She has no loss of range of motion of the knee.  Past Medical History  Diagnosis Date  . Tachycardia   . Fibromyalgia   . Hypothyroid   . Iron deficiency   . Iron deficiency anemia 05/10/2012  . ADHD (attention deficit hyperactivity disorder)    Past Surgical History  Procedure Laterality Date  . Gastric bypass    . Colonoscopy  04/14/12  . Esophagogastroduodenoscopy  04/14/12  . Knee arthroscopy      right  . Lasik    . Back surgery    . Cholecystectomy     Family History  Problem Relation Age of Onset  . Diabetes Mother   . Heart disease Mother   . Cancer Father   . Diabetes Father   . Heart disease Father   . Hyperlipidemia Father   . Cancer Brother     prostate  . Heart attack Brother   . Nephrotic syndrome Maternal Grandmother   . Heart attack Brother    Social History  Substance Use Topics  . Smoking status: Former Smoker -- 0.15 packs/day    Types: Cigarettes  . Smokeless tobacco: Never Used  . Alcohol Use: No   OB History    No data  available     Review of Systems  Constitutional: Negative.   HENT: Negative.   Eyes: Negative.   Respiratory: Negative.   Cardiovascular: Negative.   Neurological: Negative.     Allergies  Review of patient's allergies indicates no known allergies.  Home Medications   Prior to Admission medications   Medication Sig Start Date End Date Taking? Authorizing Provider  acetaminophen (TYLENOL) 325 MG tablet Take 650 mg by mouth every 6 (six) hours as needed for pain.    Historical Provider, MD  amLODipine (NORVASC) 5 MG tablet TAKE 1 TABLET(5 MG) BY MOUTH DAILY 02/18/15   [name], NP  atenolol (TENORMIN) 50 MG tablet TAKE 1 TABLET BY MOUTH TWICE DAILY 12/03/14   [name], NP  buPROPion (WELLBUTRIN XL) 150 MG 24 hr tablet Take 150 mg by mouth daily.    Historical Provider, MD  citalopram (CELEXA) 40 MG tablet Take 40 mg by mouth daily.    Historical Provider, MD  cyclobenzaprine (FLEXERIL) 10 MG tablet Take 10 mg by mouth at bedtime.    Historical Provider, MD  diphenhydrAMINE (BENADRYL) 25 mg capsule Take 25 mg by mouth every 6 (six) hours as needed for itching.    Historical Provider, MD  ferrous sulfate 325 (65 FE) MG tablet Take  325 mg by mouth daily.     Historical Provider, MD  LACTOBACILLUS BIFIDUS PO Take by mouth.    Historical Provider, MD  magnesium citrate SOLN Take 1 Bottle by mouth once.    Historical Provider, MD  Multiple Vitamins-Minerals (MULTIVITAMIN PO) Take 1 tablet by mouth daily.    Historical Provider, MD  omeprazole (PRILOSEC) 20 MG capsule TK 1 C PO QD 12/26/14   Historical Provider, MD  SENNOSIDES PO Take 1 tablet by mouth as needed.    Historical Provider, MD  thyroid (ARMOUR THYROID) 60 MG tablet Take 1 tablet (60 mg total) by mouth daily before breakfast. 12/05/14   [name], NP  Vitamin D, Ergocalciferol, (DRISDOL) 50000 UNITS CAPS capsule TAKE 1 CAPSULE BY MOUTH ONCE A WEEK 11/26/14   [name], NP   Meds Ordered and Administered this Visit   Medications - No data to display  BP 87/50 mmHg  Pulse 68  Temp(Src) 97.7 F (36.5 C) (Oral)  Resp 16  SpO2 95%  LMP 04/05/2014 (Approximate) No data found.   Physical Exam  Constitutional: She is oriented to person, place, and time. She appears well-developed and well-nourished.  HENT:  Head: Normocephalic and atraumatic.  Right Ear: External ear normal.  Left Ear: External ear normal.  Eyes: Conjunctivae and EOM are normal. Pupils are equal, round, and reactive to light.  Neck: Normal range of motion. Neck supple. No tracheal deviation present.  Cardiovascular: Normal rate.   Musculoskeletal:  Right knee: Full range of motion, minimal swelling, no effusion, ecchymosis right lateral anterior tibial plateau, no ligamentous laxity Right hand: Mild erythema over the fourth and fifth metacarpals, tender right MCP joint, inability to completely flex fifth and sees pinky) finger  Neurological: She is alert and oriented to person, place, and time. No cranial nerve deficit. Coordination normal.  Skin: Skin is warm and dry. No rash noted.  Psychiatric: She has a normal mood and affect. Her behavior is normal. Judgment and thought content normal.  Nursing note and vitals reviewed.   ED Course  Procedures (including critical care time)  Right hand x-rays are normal    MDM       ICD-9-CM ICD-10-CM   1. Hand sprain, right, initial encounter 842.10 S63.91XA   2. Knee contusion, right, initial encounter 924.11 S80.01XA    these problems are mild and should resolve in a week. This is explained to the patient. Signed, Elvina Sidle, MD    Elvina Sidle, MD 02/24/15 [phone]

## 2015-02-24 NOTE — ED Notes (Signed)
Patient reports falling yesterday.  Patient was getting groceries in, stepped wrong on walkway and fell.  Continued pain in wrist and knee.

## 2015-03-06 ENCOUNTER — Other Ambulatory Visit: Payer: Self-pay | Admitting: Unknown Physician Specialty

## 2015-03-14 ENCOUNTER — Other Ambulatory Visit: Payer: Self-pay | Admitting: Unknown Physician Specialty

## 2015-03-23 ENCOUNTER — Other Ambulatory Visit: Payer: Self-pay | Admitting: Unknown Physician Specialty

## 2015-03-27 ENCOUNTER — Other Ambulatory Visit: Payer: Self-pay | Admitting: Unknown Physician Specialty

## 2015-04-15 ENCOUNTER — Other Ambulatory Visit: Payer: Self-pay | Admitting: Unknown Physician Specialty

## 2015-05-20 ENCOUNTER — Encounter: Payer: Self-pay | Admitting: Unknown Physician Specialty

## 2015-07-08 ENCOUNTER — Other Ambulatory Visit: Payer: Self-pay

## 2015-07-08 MED ORDER — CYCLOBENZAPRINE HCL 10 MG PO TABS: 10.0000 mg | ORAL_TABLET | Freq: Every day | ORAL | Status: DC

## 2015-07-08 NOTE — Telephone Encounter (Signed)
Called and left patient a voicemail asking for her to please return my call to scheduled 6 month f/u.

## 2015-07-08 NOTE — Telephone Encounter (Signed)
Patient was last seen 01/09/15 and pharmacy is [facility] in [place]. Patient is supposed to have 6 month f/u scheduled according to last OV note. Jessica Nunez, please route this back to me so I ca call and get this patient an appointment scheduled.

## 2015-07-09 NOTE — Telephone Encounter (Signed)
Called and scheduled patient's 6 month f/u for 08/07/15.

## 2015-08-07 ENCOUNTER — Ambulatory Visit: Payer: Self-pay | Admitting: Unknown Physician Specialty

## 2015-10-21 ENCOUNTER — Other Ambulatory Visit: Payer: Self-pay | Admitting: Unknown Physician Specialty

## 2015-12-03 ENCOUNTER — Other Ambulatory Visit (HOSPITAL_COMMUNITY): Payer: Self-pay | Admitting: Adult Health Nurse Practitioner

## 2015-12-03 ENCOUNTER — Emergency Department (HOSPITAL_COMMUNITY)
Admission: EM | Admit: 2015-12-03 | Discharge: 2015-12-03 | Discharge status: Absent without leave | Payer: BLUE CROSS/BLUE SHIELD

## 2015-12-03 ENCOUNTER — Ambulatory Visit (HOSPITAL_COMMUNITY)
Admission: RE | Admit: 2015-12-03 | Discharge: 2015-12-03 | Disposition: A | Discharge status: Home / Self Care | Payer: Self-pay | Source: Ambulatory Visit | Attending: Adult Health Nurse Practitioner | Admitting: Adult Health Nurse Practitioner

## 2015-12-03 DIAGNOSIS — T1490XA Injury, unspecified, initial encounter: Secondary | ICD-10-CM

## 2015-12-03 DIAGNOSIS — X58XXXA Exposure to other specified factors, initial encounter: Secondary | ICD-10-CM | POA: Insufficient documentation

## 2015-12-03 DIAGNOSIS — T1490 Injury, unspecified: Secondary | ICD-10-CM | POA: Insufficient documentation

## 2017-04-21 ENCOUNTER — Other Ambulatory Visit: Payer: Self-pay | Admitting: Obstetrics and Gynecology

## 2017-04-21 DIAGNOSIS — R234 Changes in skin texture: Secondary | ICD-10-CM

## 2017-04-27 ENCOUNTER — Ambulatory Visit
Admission: RE | Admit: 2017-04-27 | Discharge: 2017-04-27 | Disposition: A | Discharge status: Home / Self Care | Payer: BLUE CROSS/BLUE SHIELD | Source: Ambulatory Visit | Attending: Obstetrics and Gynecology | Admitting: Obstetrics and Gynecology

## 2017-04-27 DIAGNOSIS — R234 Changes in skin texture: Secondary | ICD-10-CM

## 2017-05-19 DIAGNOSIS — Z8042 Family history of malignant neoplasm of prostate: Secondary | ICD-10-CM | POA: Diagnosis not present

## 2017-05-19 DIAGNOSIS — Z808 Family history of malignant neoplasm of other organs or systems: Secondary | ICD-10-CM | POA: Diagnosis not present

## 2017-05-19 DIAGNOSIS — Z8041 Family history of malignant neoplasm of ovary: Secondary | ICD-10-CM | POA: Diagnosis not present

## 2017-05-19 DIAGNOSIS — Z803 Family history of malignant neoplasm of breast: Secondary | ICD-10-CM | POA: Diagnosis not present

## 2017-09-23 ENCOUNTER — Other Ambulatory Visit: Payer: Self-pay | Admitting: Specialist

## 2017-09-23 DIAGNOSIS — R112 Nausea with vomiting, unspecified: Secondary | ICD-10-CM

## 2017-10-01 ENCOUNTER — Ambulatory Visit
Admission: RE | Admit: 2017-10-01 | Discharge: 2017-10-01 | Disposition: A | Discharge status: Home / Self Care | Payer: BLUE CROSS/BLUE SHIELD | Source: Ambulatory Visit | Attending: Specialist | Admitting: Specialist

## 2017-10-01 DIAGNOSIS — Z9884 Bariatric surgery status: Secondary | ICD-10-CM | POA: Diagnosis present

## 2017-10-01 DIAGNOSIS — R112 Nausea with vomiting, unspecified: Secondary | ICD-10-CM

## 2017-10-01 MED ORDER — TECHNETIUM TC 99M SULFUR COLLOID
2.3100 | Freq: Once | INTRAVENOUS | Status: AC | PRN
  Administered 2017-10-01: 2.31 via ORAL

## 2017-10-06 ENCOUNTER — Other Ambulatory Visit: Payer: Self-pay | Admitting: Specialist

## 2017-10-06 DIAGNOSIS — R112 Nausea with vomiting, unspecified: Secondary | ICD-10-CM

## 2017-10-06 DIAGNOSIS — Z9884 Bariatric surgery status: Secondary | ICD-10-CM

## 2017-10-11 ENCOUNTER — Ambulatory Visit: Admission: RE | Admit: 2017-10-11 | Payer: BLUE CROSS/BLUE SHIELD | Source: Ambulatory Visit

## 2017-10-11 ENCOUNTER — Ambulatory Visit
Admission: RE | Admit: 2017-10-11 | Discharge: 2017-10-11 | Disposition: A | Discharge status: Home / Self Care | Payer: BLUE CROSS/BLUE SHIELD | Source: Ambulatory Visit | Attending: Specialist | Admitting: Specialist

## 2017-10-11 DIAGNOSIS — R112 Nausea with vomiting, unspecified: Secondary | ICD-10-CM | POA: Diagnosis not present

## 2017-10-11 DIAGNOSIS — Z9884 Bariatric surgery status: Secondary | ICD-10-CM

## 2017-10-14 ENCOUNTER — Other Ambulatory Visit: Payer: Self-pay | Admitting: Specialist

## 2017-10-14 DIAGNOSIS — R112 Nausea with vomiting, unspecified: Secondary | ICD-10-CM

## 2017-10-22 ENCOUNTER — Ambulatory Visit: Payer: BLUE CROSS/BLUE SHIELD

## 2017-10-28 DIAGNOSIS — E039 Hypothyroidism, unspecified: Secondary | ICD-10-CM | POA: Diagnosis not present

## 2017-10-28 DIAGNOSIS — K219 Gastro-esophageal reflux disease without esophagitis: Secondary | ICD-10-CM | POA: Diagnosis not present

## 2017-10-28 DIAGNOSIS — J45909 Unspecified asthma, uncomplicated: Secondary | ICD-10-CM | POA: Diagnosis not present

## 2017-10-28 DIAGNOSIS — R109 Unspecified abdominal pain: Secondary | ICD-10-CM | POA: Diagnosis not present

## 2017-12-27 DIAGNOSIS — K561 Intussusception: Secondary | ICD-10-CM | POA: Diagnosis not present

## 2017-12-27 DIAGNOSIS — E039 Hypothyroidism, unspecified: Secondary | ICD-10-CM | POA: Diagnosis not present

## 2017-12-27 DIAGNOSIS — K469 Unspecified abdominal hernia without obstruction or gangrene: Secondary | ICD-10-CM | POA: Diagnosis not present

## 2017-12-27 DIAGNOSIS — R109 Unspecified abdominal pain: Secondary | ICD-10-CM | POA: Diagnosis not present

## 2018-01-19 ENCOUNTER — Ambulatory Visit: Payer: BLUE CROSS/BLUE SHIELD

## 2018-01-27 DIAGNOSIS — Z9889 Other specified postprocedural states: Secondary | ICD-10-CM | POA: Diagnosis not present

## 2018-01-27 DIAGNOSIS — E039 Hypothyroidism, unspecified: Secondary | ICD-10-CM | POA: Diagnosis not present

## 2018-01-27 DIAGNOSIS — K219 Gastro-esophageal reflux disease without esophagitis: Secondary | ICD-10-CM | POA: Diagnosis not present

## 2018-01-27 DIAGNOSIS — K311 Adult hypertrophic pyloric stenosis: Secondary | ICD-10-CM | POA: Diagnosis not present

## 2018-01-27 DIAGNOSIS — Z87442 Personal history of urinary calculi: Secondary | ICD-10-CM | POA: Diagnosis not present

## 2018-01-27 DIAGNOSIS — F418 Other specified anxiety disorders: Secondary | ICD-10-CM | POA: Diagnosis not present

## 2018-01-27 DIAGNOSIS — D649 Anemia, unspecified: Secondary | ICD-10-CM | POA: Diagnosis not present

## 2018-01-27 DIAGNOSIS — Z9884 Bariatric surgery status: Secondary | ICD-10-CM | POA: Diagnosis not present

## 2018-01-27 DIAGNOSIS — N184 Chronic kidney disease, stage 4 (severe): Secondary | ICD-10-CM | POA: Diagnosis not present

## 2018-01-27 DIAGNOSIS — Z87891 Personal history of nicotine dependence: Secondary | ICD-10-CM | POA: Diagnosis not present

## 2018-01-27 DIAGNOSIS — Z79899 Other long term (current) drug therapy: Secondary | ICD-10-CM | POA: Diagnosis not present

## 2018-02-18 ENCOUNTER — Other Ambulatory Visit (HOSPITAL_COMMUNITY): Payer: Self-pay | Admitting: Adult Health Nurse Practitioner

## 2018-02-18 ENCOUNTER — Ambulatory Visit (HOSPITAL_COMMUNITY)
Admission: RE | Admit: 2018-02-18 | Discharge: 2018-02-18 | Disposition: A | Discharge status: Home / Self Care | Payer: BLUE CROSS/BLUE SHIELD | Source: Ambulatory Visit | Attending: Adult Health Nurse Practitioner | Admitting: Adult Health Nurse Practitioner

## 2018-02-18 DIAGNOSIS — M25532 Pain in left wrist: Secondary | ICD-10-CM

## 2018-02-18 DIAGNOSIS — W19XXXA Unspecified fall, initial encounter: Secondary | ICD-10-CM

## 2018-02-18 DIAGNOSIS — S6992XA Unspecified injury of left wrist, hand and finger(s), initial encounter: Secondary | ICD-10-CM | POA: Diagnosis not present

## 2018-03-03 DIAGNOSIS — K311 Adult hypertrophic pyloric stenosis: Secondary | ICD-10-CM | POA: Diagnosis not present

## 2018-03-03 DIAGNOSIS — E039 Hypothyroidism, unspecified: Secondary | ICD-10-CM | POA: Diagnosis not present

## 2018-03-03 DIAGNOSIS — Z6835 Body mass index (BMI) 35.0-35.9, adult: Secondary | ICD-10-CM | POA: Diagnosis not present

## 2018-03-04 DIAGNOSIS — Z9884 Bariatric surgery status: Secondary | ICD-10-CM | POA: Diagnosis not present

## 2018-03-04 DIAGNOSIS — M810 Age-related osteoporosis without current pathological fracture: Secondary | ICD-10-CM | POA: Diagnosis not present

## 2018-03-04 DIAGNOSIS — K311 Adult hypertrophic pyloric stenosis: Secondary | ICD-10-CM | POA: Diagnosis not present

## 2018-03-04 DIAGNOSIS — K5900 Constipation, unspecified: Secondary | ICD-10-CM | POA: Diagnosis not present

## 2018-03-04 DIAGNOSIS — Z87891 Personal history of nicotine dependence: Secondary | ICD-10-CM | POA: Diagnosis not present

## 2018-03-04 DIAGNOSIS — Z8249 Family history of ischemic heart disease and other diseases of the circulatory system: Secondary | ICD-10-CM | POA: Diagnosis not present

## 2018-03-04 DIAGNOSIS — Z885 Allergy status to narcotic agent status: Secondary | ICD-10-CM | POA: Diagnosis not present

## 2018-03-04 DIAGNOSIS — F329 Major depressive disorder, single episode, unspecified: Secondary | ICD-10-CM | POA: Diagnosis not present

## 2018-03-04 DIAGNOSIS — Z79899 Other long term (current) drug therapy: Secondary | ICD-10-CM | POA: Diagnosis not present

## 2018-03-04 DIAGNOSIS — R1310 Dysphagia, unspecified: Secondary | ICD-10-CM | POA: Diagnosis not present

## 2018-03-04 DIAGNOSIS — F419 Anxiety disorder, unspecified: Secondary | ICD-10-CM | POA: Diagnosis not present

## 2018-03-04 DIAGNOSIS — E039 Hypothyroidism, unspecified: Secondary | ICD-10-CM | POA: Diagnosis not present

## 2018-03-04 DIAGNOSIS — M1990 Unspecified osteoarthritis, unspecified site: Secondary | ICD-10-CM | POA: Diagnosis not present

## 2018-03-04 DIAGNOSIS — Z9889 Other specified postprocedural states: Secondary | ICD-10-CM | POA: Diagnosis not present

## 2018-03-04 DIAGNOSIS — K219 Gastro-esophageal reflux disease without esophagitis: Secondary | ICD-10-CM | POA: Diagnosis not present

## 2018-04-26 DIAGNOSIS — E039 Hypothyroidism, unspecified: Secondary | ICD-10-CM | POA: Diagnosis not present

## 2018-04-26 DIAGNOSIS — R112 Nausea with vomiting, unspecified: Secondary | ICD-10-CM | POA: Diagnosis not present

## 2018-04-26 DIAGNOSIS — K909 Intestinal malabsorption, unspecified: Secondary | ICD-10-CM | POA: Diagnosis not present

## 2018-04-26 DIAGNOSIS — Z9884 Bariatric surgery status: Secondary | ICD-10-CM | POA: Diagnosis not present

## 2018-05-30 DIAGNOSIS — H01003 Unspecified blepharitis right eye, unspecified eyelid: Secondary | ICD-10-CM | POA: Diagnosis not present

## 2018-05-30 DIAGNOSIS — H1033 Unspecified acute conjunctivitis, bilateral: Secondary | ICD-10-CM | POA: Diagnosis not present

## 2018-06-24 DIAGNOSIS — R718 Other abnormality of red blood cells: Secondary | ICD-10-CM | POA: Diagnosis not present

## 2018-06-24 DIAGNOSIS — D539 Nutritional anemia, unspecified: Secondary | ICD-10-CM | POA: Diagnosis not present

## 2018-06-24 DIAGNOSIS — D509 Iron deficiency anemia, unspecified: Secondary | ICD-10-CM | POA: Diagnosis not present

## 2018-07-01 DIAGNOSIS — D539 Nutritional anemia, unspecified: Secondary | ICD-10-CM | POA: Diagnosis not present

## 2018-07-06 DIAGNOSIS — D509 Iron deficiency anemia, unspecified: Secondary | ICD-10-CM | POA: Diagnosis not present

## 2018-07-06 DIAGNOSIS — R718 Other abnormality of red blood cells: Secondary | ICD-10-CM | POA: Diagnosis not present

## 2018-07-13 DIAGNOSIS — D509 Iron deficiency anemia, unspecified: Secondary | ICD-10-CM | POA: Diagnosis not present

## 2018-08-10 DIAGNOSIS — D539 Nutritional anemia, unspecified: Secondary | ICD-10-CM | POA: Diagnosis not present

## 2018-08-10 DIAGNOSIS — D509 Iron deficiency anemia, unspecified: Secondary | ICD-10-CM | POA: Diagnosis not present

## 2018-08-24 DIAGNOSIS — R718 Other abnormality of red blood cells: Secondary | ICD-10-CM | POA: Diagnosis not present

## 2018-08-24 DIAGNOSIS — D509 Iron deficiency anemia, unspecified: Secondary | ICD-10-CM | POA: Diagnosis not present

## 2018-08-24 DIAGNOSIS — D539 Nutritional anemia, unspecified: Secondary | ICD-10-CM | POA: Diagnosis not present

## 2018-08-31 DIAGNOSIS — S8255XA Nondisplaced fracture of medial malleolus of left tibia, initial encounter for closed fracture: Secondary | ICD-10-CM | POA: Diagnosis not present

## 2018-08-31 DIAGNOSIS — S92115A Nondisplaced fracture of neck of left talus, initial encounter for closed fracture: Secondary | ICD-10-CM | POA: Diagnosis not present

## 2018-08-31 DIAGNOSIS — S8262XA Displaced fracture of lateral malleolus of left fibula, initial encounter for closed fracture: Secondary | ICD-10-CM | POA: Diagnosis not present

## 2018-08-31 DIAGNOSIS — S8265XA Nondisplaced fracture of lateral malleolus of left fibula, initial encounter for closed fracture: Secondary | ICD-10-CM | POA: Diagnosis not present

## 2018-09-01 DIAGNOSIS — S82842D Displaced bimalleolar fracture of left lower leg, subsequent encounter for closed fracture with routine healing: Secondary | ICD-10-CM | POA: Diagnosis not present

## 2018-09-01 DIAGNOSIS — S92122D Displaced fracture of body of left talus, subsequent encounter for fracture with routine healing: Secondary | ICD-10-CM | POA: Diagnosis not present

## 2018-09-01 DIAGNOSIS — Z01818 Encounter for other preprocedural examination: Secondary | ICD-10-CM | POA: Diagnosis not present

## 2018-09-07 DIAGNOSIS — S82842A Displaced bimalleolar fracture of left lower leg, initial encounter for closed fracture: Secondary | ICD-10-CM | POA: Diagnosis not present

## 2018-09-07 DIAGNOSIS — S82842D Displaced bimalleolar fracture of left lower leg, subsequent encounter for closed fracture with routine healing: Secondary | ICD-10-CM | POA: Diagnosis not present

## 2018-09-07 DIAGNOSIS — M25572 Pain in left ankle and joints of left foot: Secondary | ICD-10-CM | POA: Diagnosis not present

## 2018-09-07 DIAGNOSIS — S92192A Other fracture of left talus, initial encounter for closed fracture: Secondary | ICD-10-CM | POA: Diagnosis not present

## 2018-09-07 DIAGNOSIS — S92122D Displaced fracture of body of left talus, subsequent encounter for fracture with routine healing: Secondary | ICD-10-CM | POA: Diagnosis not present

## 2018-09-07 DIAGNOSIS — G8918 Other acute postprocedural pain: Secondary | ICD-10-CM | POA: Diagnosis not present

## 2018-09-12 DIAGNOSIS — D509 Iron deficiency anemia, unspecified: Secondary | ICD-10-CM | POA: Diagnosis not present

## 2018-09-12 DIAGNOSIS — R718 Other abnormality of red blood cells: Secondary | ICD-10-CM | POA: Diagnosis not present

## 2018-09-12 DIAGNOSIS — D539 Nutritional anemia, unspecified: Secondary | ICD-10-CM | POA: Diagnosis not present

## 2018-09-20 DIAGNOSIS — S82842D Displaced bimalleolar fracture of left lower leg, subsequent encounter for closed fracture with routine healing: Secondary | ICD-10-CM | POA: Diagnosis not present

## 2018-10-18 DIAGNOSIS — S82842D Displaced bimalleolar fracture of left lower leg, subsequent encounter for closed fracture with routine healing: Secondary | ICD-10-CM | POA: Diagnosis not present

## 2018-10-27 DIAGNOSIS — D539 Nutritional anemia, unspecified: Secondary | ICD-10-CM | POA: Diagnosis not present

## 2018-10-27 DIAGNOSIS — D509 Iron deficiency anemia, unspecified: Secondary | ICD-10-CM | POA: Diagnosis not present

## 2018-11-02 DIAGNOSIS — D539 Nutritional anemia, unspecified: Secondary | ICD-10-CM | POA: Diagnosis not present

## 2018-11-02 DIAGNOSIS — D509 Iron deficiency anemia, unspecified: Secondary | ICD-10-CM | POA: Diagnosis not present

## 2018-11-29 DIAGNOSIS — S82842K Displaced bimalleolar fracture of left lower leg, subsequent encounter for closed fracture with nonunion: Secondary | ICD-10-CM | POA: Diagnosis not present

## 2018-12-06 DIAGNOSIS — R718 Other abnormality of red blood cells: Secondary | ICD-10-CM | POA: Diagnosis not present

## 2018-12-06 DIAGNOSIS — D539 Nutritional anemia, unspecified: Secondary | ICD-10-CM | POA: Diagnosis not present

## 2018-12-06 DIAGNOSIS — D509 Iron deficiency anemia, unspecified: Secondary | ICD-10-CM | POA: Diagnosis not present

## 2018-12-12 ENCOUNTER — Other Ambulatory Visit: Payer: Self-pay | Admitting: Internal Medicine

## 2018-12-12 DIAGNOSIS — R945 Abnormal results of liver function studies: Secondary | ICD-10-CM | POA: Diagnosis not present

## 2018-12-12 DIAGNOSIS — I10 Essential (primary) hypertension: Secondary | ICD-10-CM | POA: Diagnosis not present

## 2018-12-12 DIAGNOSIS — F909 Attention-deficit hyperactivity disorder, unspecified type: Secondary | ICD-10-CM | POA: Diagnosis not present

## 2018-12-12 DIAGNOSIS — Z0001 Encounter for general adult medical examination with abnormal findings: Secondary | ICD-10-CM | POA: Diagnosis not present

## 2018-12-12 DIAGNOSIS — Z1231 Encounter for screening mammogram for malignant neoplasm of breast: Secondary | ICD-10-CM

## 2019-01-10 DIAGNOSIS — S82842K Displaced bimalleolar fracture of left lower leg, subsequent encounter for closed fracture with nonunion: Secondary | ICD-10-CM | POA: Diagnosis not present

## 2019-01-11 DIAGNOSIS — M25572 Pain in left ankle and joints of left foot: Secondary | ICD-10-CM | POA: Diagnosis not present

## 2019-01-16 DIAGNOSIS — R718 Other abnormality of red blood cells: Secondary | ICD-10-CM | POA: Diagnosis not present

## 2019-01-16 DIAGNOSIS — D509 Iron deficiency anemia, unspecified: Secondary | ICD-10-CM | POA: Diagnosis not present

## 2019-01-16 DIAGNOSIS — D539 Nutritional anemia, unspecified: Secondary | ICD-10-CM | POA: Diagnosis not present

## 2019-01-17 DIAGNOSIS — M25572 Pain in left ankle and joints of left foot: Secondary | ICD-10-CM | POA: Diagnosis not present

## 2019-01-24 DIAGNOSIS — M25572 Pain in left ankle and joints of left foot: Secondary | ICD-10-CM | POA: Diagnosis not present

## 2019-01-30 DIAGNOSIS — H903 Sensorineural hearing loss, bilateral: Secondary | ICD-10-CM | POA: Diagnosis not present

## 2019-03-20 IMAGING — RF DG UGI W/ SMALL BOWEL
11 of 17 series · 13 of 24 positions shown · non-contrast
Comparison: None.

CLINICAL DATA: Vomiting for 6 months.

EXAM:
UPPER GI SERIES WITH SMALL BOWEL FOLLOW-THROUGH
FLUOROSCOPY TIME:  Fluoroscopy Time:  1 minutes 42 seconds
Radiation Exposure Index (if provided by the fluoroscopic device):
83.2 mGy
Number of Acquired Spot Images: 0
TECHNIQUE: Combined double contrast and single contrast upper GI series using
effervescent crystals, thick barium, and thin barium. Subsequently,
serial images of the small bowel were obtained including spot views
of the terminal ileum.

[Series 1: t abdomen supine · 0.15mm/px · 1 of 1 slices shown]
[im 1/1]
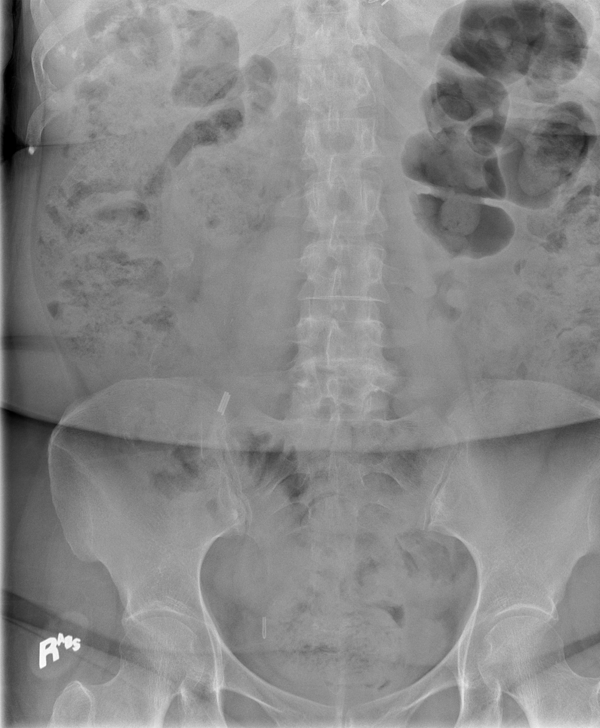

[Series 2: cp_standard · 0.51mm/px · 1 of 33 frames shown (1 of 9)]
[frame 17/33]
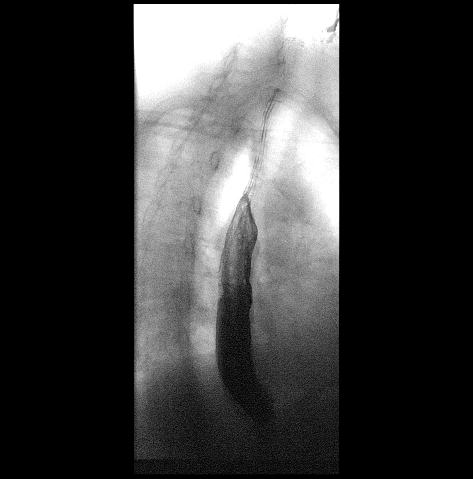

[Series 4: cp_standard · 0.25mm/px · 1 of 1 slices shown (2 of 9)]
[im 1/1]
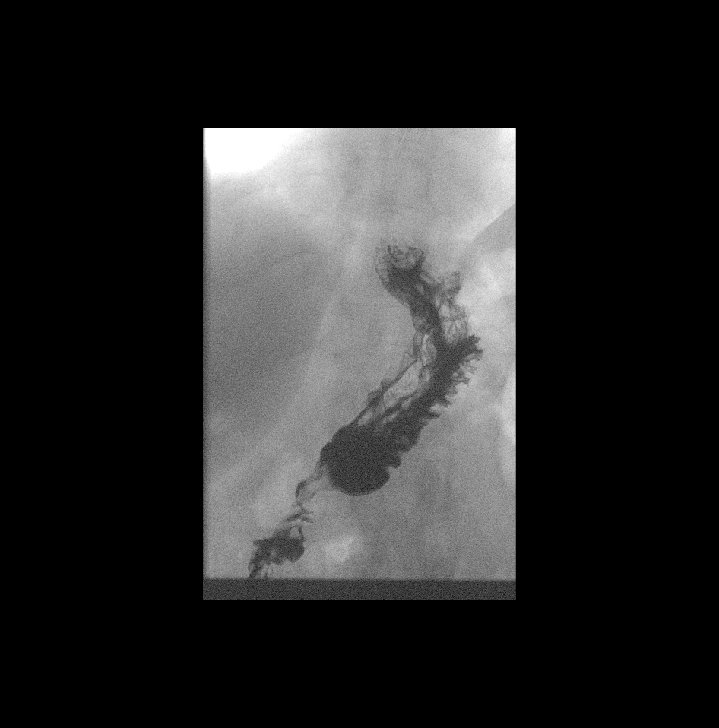

[Series 7: cp_standard · 0.25mm/px · 1 of 1 slices shown (3 of 9)]
[im 1/1]
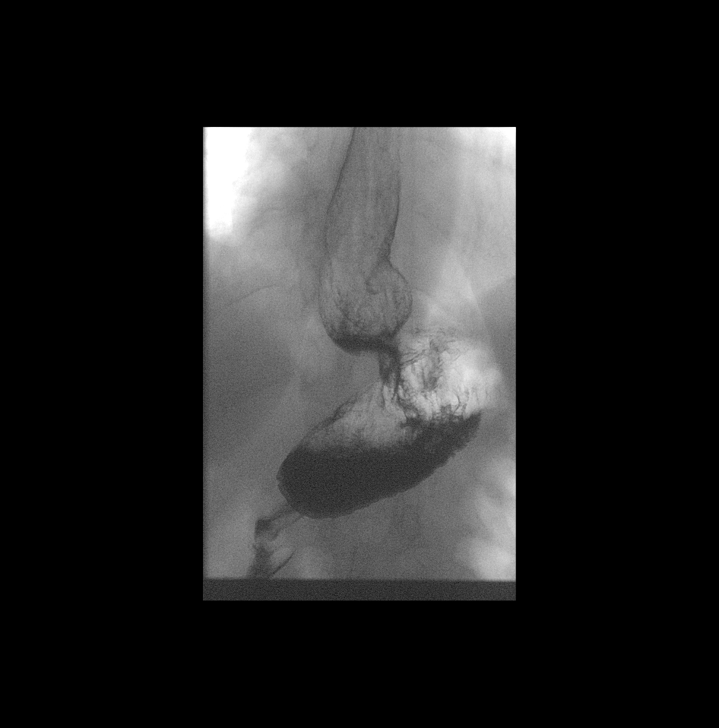

[Series 10: cp_standard · 0.28mm/px · 1 of 1 slices shown (4 of 9)]
[im 1/1]
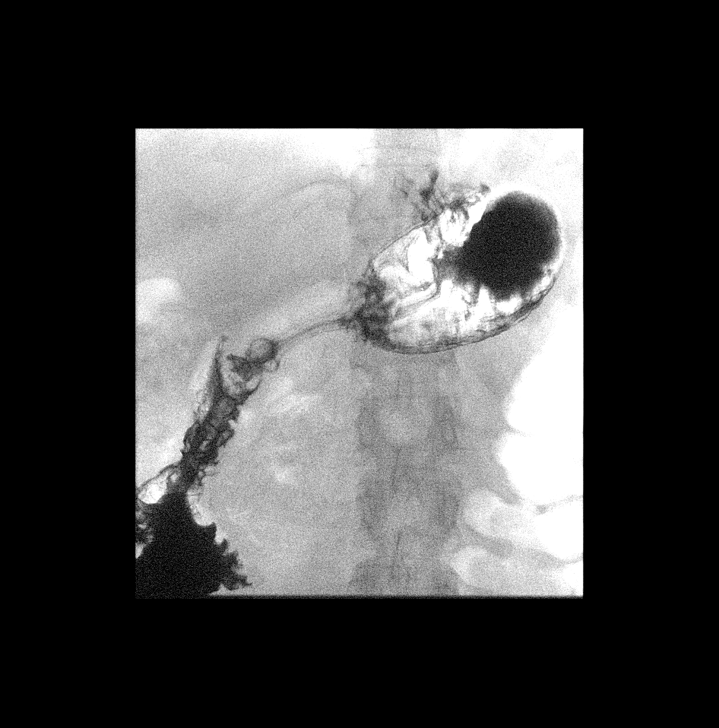

[Series 13: cp_standard · 0.28mm/px · 1 of 1 slices shown (5 of 9)]
[im 1/1]
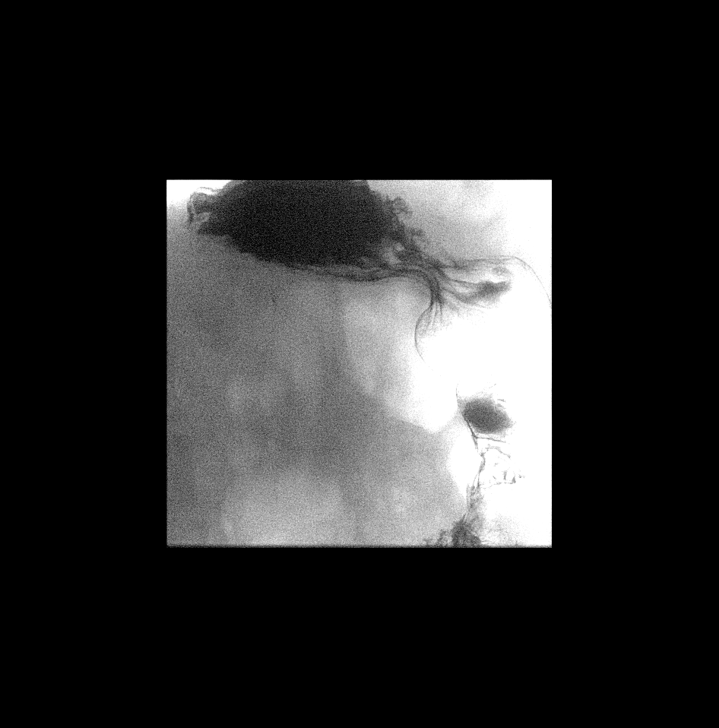

[Series 14: cp_standard · 0.56mm/px · 2 acquisitions, 1 frame shown (6 of 9)]
[im 1/2]
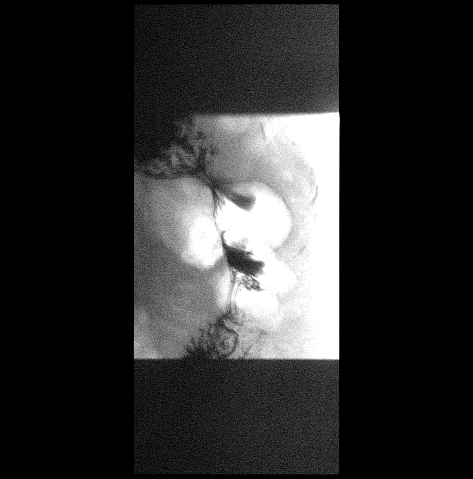

[Series 16: cp_standard · 0.56mm/px · 2 of 34 frames shown (7 of 9)]
[frame 1/34]
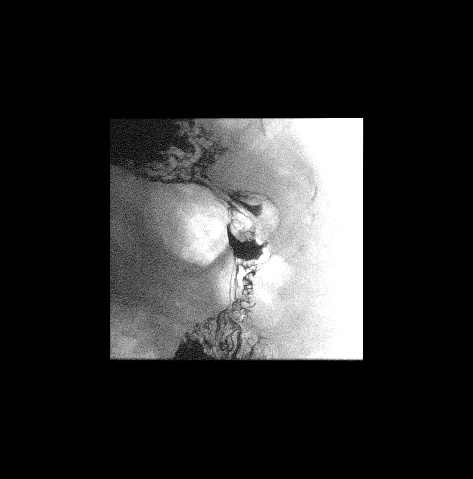
[frame 29/34]
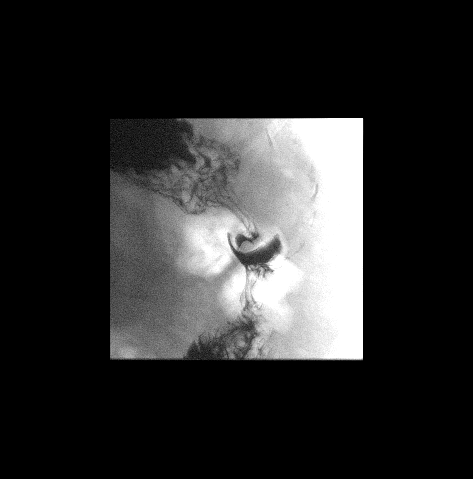

[Series 19: cp_standard · 0.29mm/px · 1 of 1 slices shown (8 of 9)]
[im 1/1]
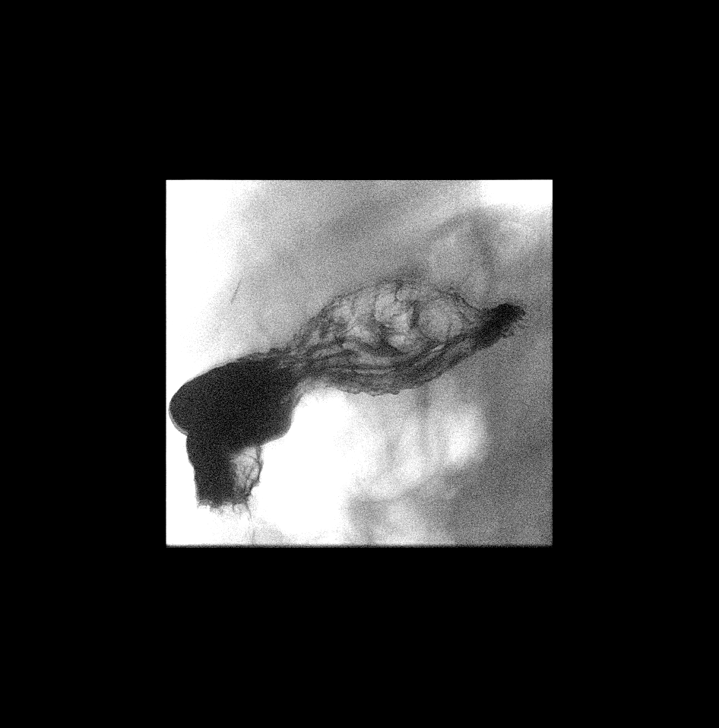

[Series 23: cp_standard · 0.58mm/px · 2 acquisitions, 2 frames shown (9 of 9)]
[im 1/2]
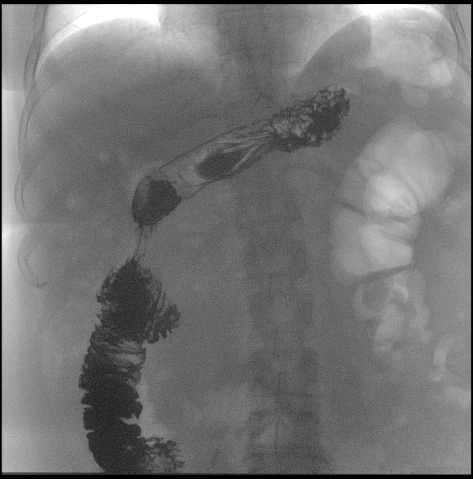
[im 1/2]
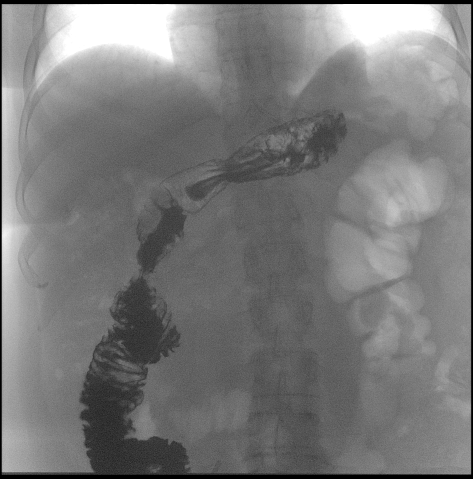

[Series 26: t abdomen barium ap · 0.15mm/px · 1 of 1 slices shown]
[im 1/1]
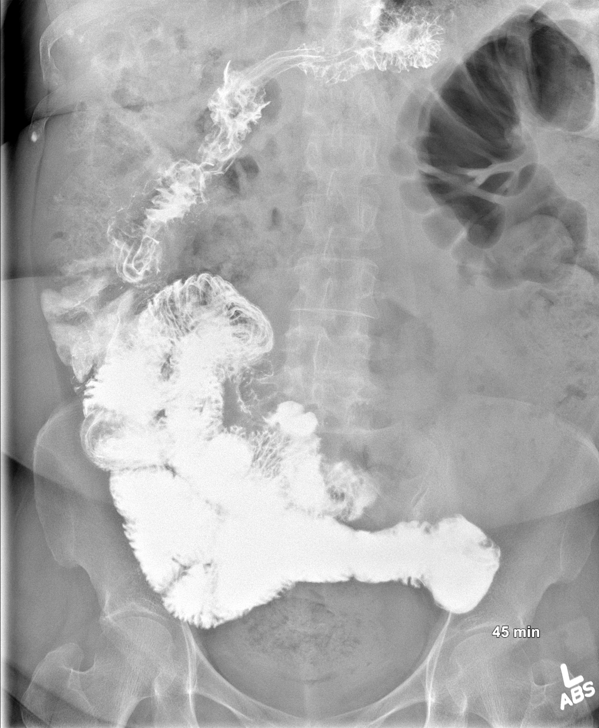

[13 of 24 positions shown; findings below may reference images not displayed]

FINDINGS: KUB:

There is a large amount of stool throughout the colon. There is no
bowel dilatation to suggest obstruction. There is no evidence of
pneumoperitoneum, portal venous gas or pneumatosis.

There are no pathologic calcifications along the expected course of
the ureters.

The osseous structures are unremarkable.

UPPER GI AND SMALL-BOWEL FOLLOW-THROUGH:

Examination of the esophagus demonstrated normal esophageal
motility. Normal esophageal morphology without evidence of
esophagitis or ulceration. No esophageal stricture, diverticula, or
mass lesion. No evidence of hiatal hernia. There is no spontaneous
or inducible gastroesophageal reflux.

Prior Roux-en-Y gastric bypass and subsequent duodenal switch
procedure. The gastric remanent demonstrates normal gastric mucosa
appeared unremarkable without evidence of ulceration, scarring, or
mass lesion. Gastric emptying was normal.

Medium density barium was periodically observed under fluoroscopy to
travel from the stomach to the ascending colon (over a 45 minute
time period). There is no evidence of small bowel stricture or
obstruction. No large filling defects to suggest mass lesion. In
addition, there is no evidence of tethering or definite inflammatory
changes present within the small bowel.
IMPRESSION: 1. Prior gastric bypass without focal abnormality. No evidence of
ulceration or mass lesion. No gastric outlet obstruction.
2. Normal small bowel follow-through.

## 2021-01-24 ENCOUNTER — Other Ambulatory Visit: Payer: Self-pay

## 2021-01-24 ENCOUNTER — Ambulatory Visit (INDEPENDENT_AMBULATORY_CARE_PROVIDER_SITE_OTHER): Payer: No Typology Code available for payment source

## 2021-01-24 ENCOUNTER — Ambulatory Visit
Admission: RE | Admit: 2021-01-24 | Discharge: 2021-01-24 | Disposition: A | Discharge status: Home / Self Care | Payer: No Typology Code available for payment source | Source: Ambulatory Visit

## 2021-01-24 VITALS — BP 132/88 | HR 79 | Temp 98.1°F | Resp 18

## 2021-01-24 DIAGNOSIS — W19XXXA Unspecified fall, initial encounter: Secondary | ICD-10-CM | POA: Diagnosis not present

## 2021-01-24 DIAGNOSIS — S20212A Contusion of left front wall of thorax, initial encounter: Secondary | ICD-10-CM

## 2021-01-24 DIAGNOSIS — S29019A Strain of muscle and tendon of unspecified wall of thorax, initial encounter: Secondary | ICD-10-CM | POA: Diagnosis not present

## 2021-01-24 DIAGNOSIS — M79602 Pain in left arm: Secondary | ICD-10-CM | POA: Diagnosis not present

## 2021-01-24 NOTE — Discharge Instructions (Addendum)
Your rib x-ray was negative for any fractures.  Please use ice application to affected area of pain.  You may alternate ice and heat application to affected area of pain on the back.  Take Tylenol as needed for pain as you are unable to take ibuprofen.  Please follow-up with primary care physician if needed if pain persists.

## 2021-01-24 NOTE — ED Provider Notes (Signed)
[facility]    CSN: [id number] Arrival date & time: 01/24/21  1601      History   Chief Complaint Chief Complaint  Patient presents with   rib discomfort    HPI Jessica Nunez is a 55 y.o. female.   Patient presents with left-sided rib pain, thoracic back pain, left arm pain that has been present since a fall that occurred a few days prior.  States that it feels difficult to take a deep breath due to rib pain.  Patient has taken Tylenol, oxycodone, Motrin at home without relief of pain.  Denies hitting head or losing consciousness with fall.  Fall occurred while walking up an incline with flip-flops.  Patient states that she tripped and fell forward.  Patient does have history of lower back pain where she is followed by spine center as well as a pain clinic.  Patient takes oxycodone as needed for back pain.  Thoracic back pain is new and has been present since fall.    Past Medical History:  Diagnosis Date   ADHD (attention deficit hyperactivity disorder)    Fibromyalgia    Hypothyroid    Iron deficiency    Iron deficiency anemia 05/10/2012   Tachycardia     Patient Active Problem List   Diagnosis Date Noted   Essential hypertension, benign 12/05/2014   Fibromyalgia 10/16/2014   Major depression 10/16/2014   Menopause 10/16/2014   Hypothyroidism 10/16/2014   Chronic fatigue 10/16/2014   ADHD (attention deficit hyperactivity disorder) 10/16/2014   Tobacco use 10/16/2014   Extreme obesity 05/03/2014   Iron deficiency anemia 05/10/2012   History of gastric bypass, 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      Home Medications    Prior to Admission medications   Medication Sig Start Date End Date Taking?  Authorizing Provider  furosemide (LASIX) 20 MG tablet Take 20 mg by mouth daily.   Yes [provider]  oxyCODONE-acetaminophen (PERCOCET) 7.5-325 MG tablet Take 1 tablet by mouth every 8 (eight) hours as needed for severe pain.   Yes [provider]  acetaminophen (TYLENOL) 325 MG tablet Take 650 mg by mouth every 6 (six) hours as needed for pain.    [provider]  amLODipine (NORVASC) 5 MG tablet TAKE 1 TABLET(5 MG) BY MOUTH DAILY 02/18/15   [name], NP  amLODipine (NORVASC) 5 MG tablet TAKE 1 TABLET(5 MG) BY MOUTH DAILY 03/25/15   [name], NP  atenolol (TENORMIN) 50 MG tablet TAKE 1 TABLET BY MOUTH TWICE DAILY 03/14/15   [name], NP  buPROPion [facility] SR) 150 MG 12 hr tablet TAKE 1 TABLET BY MOUTH TWICE DAILY 03/07/15   [name], NP  buPROPion (WELLBUTRIN XL) 150 MG 24 hr tablet Take 150 mg by mouth daily.    [provider]  citalopram (CELEXA) 40 MG tablet Take 40 mg by mouth daily.    [provider]  cyclobenzaprine (FLEXERIL) 10 MG tablet TAKE 1 TABLET(10 MG) BY MOUTH AT BEDTIME 10/21/15   [name], NP  diphenhydrAMINE (BENADRYL) 25 mg capsule Take 25 mg by mouth every 6 (six) hours as needed for itching.  [provider]  ferrous sulfate 325 (65 FE) MG tablet Take 325 mg by mouth daily.     [provider]  LACTOBACILLUS BIFIDUS PO Take by mouth.    [provider]  magnesium citrate SOLN Take 1 Bottle by mouth once.    [provider]  Multiple Vitamins-Minerals (MULTIVITAMIN PO) Take 1 tablet by mouth daily.    [provider]  NP THYROID 60 MG tablet TAKE 1 TABLET(60 MG) BY MOUTH DAILY BEFORE BREAKFAST 04/15/15   [name], NP  omeprazole (PRILOSEC) 20 MG capsule TAKE 1 CAPSULE BY MOUTH EVERY DAY 10/21/15   [name], NP  SENNOSIDES PO Take 1 tablet by mouth as needed.    [provider]  Vitamin D, Ergocalciferol, (DRISDOL) 50000 UNITS CAPS  capsule TAKE 1 CAPSULE BY MOUTH ONCE A WEEK 11/26/14   [name], NP    Family History Family History  Problem Relation Age of Onset   Diabetes Mother    Heart disease Mother    Cancer Father    Diabetes Father    Heart disease Father    Hyperlipidemia Father    Cancer Brother        prostate   Heart attack Brother    Nephrotic syndrome Maternal Grandmother    Heart attack Brother     Social History Social History   Tobacco Use   Smoking status: Former    Packs/day: 0.15    Types: Cigarettes   Smokeless tobacco: Never  Substance Use Topics   Alcohol use: No   Drug use: No     Allergies   Hydrocodone   Review of Systems Review of Systems Per HPI  Physical Exam Triage Vital Signs ED Triage Vitals [01/24/21 1629]  Enc Vitals Group     BP 132/88     Pulse Rate 79     Resp 18     Temp 98.1 F (36.7 C)     Temp Source Oral     SpO2 98 %     Weight      Height      Head Circumference      Peak Flow      Pain Score 7     Pain Loc      Pain Edu?      Excl. in GC?    No data found.  Updated Vital Signs BP 132/88 (BP Location: Left Arm)   Pulse 79   Temp 98.1 F (36.7 C) (Oral)   Resp 18   LMP 04/05/2014 (Approximate)   SpO2 98%   Visual Acuity Right Eye Distance:   Left Eye Distance:   Bilateral Distance:    Right Eye Near:   Left Eye Near:    Bilateral Near:     Physical Exam Constitutional:      Appearance: Normal appearance.  HENT:     Head: Normocephalic and atraumatic.  Eyes:     Extraocular Movements: Extraocular movements intact.     Conjunctiva/sclera: Conjunctivae normal.  Cardiovascular:     Rate and Rhythm: Normal rate and regular rhythm.     Pulses: Normal pulses.     Heart sounds: Normal heart sounds.  Pulmonary:     Effort: Pulmonary effort is normal.     Breath sounds: Normal breath sounds.  Chest:     Comments: Tenderness to palpation to approximately ribs 2 through 6 on left side. Musculoskeletal:     Right  upper arm: Normal.     Left  upper arm: Normal.     Right elbow: Normal.     Left elbow: Normal.     Right forearm: Normal.     Left forearm: Normal.     Right wrist: Normal.     Left wrist: Normal.     Right hand: Normal.     Left hand: Normal.     Cervical back: Normal.     Thoracic back: Tenderness present. No swelling, edema, signs of trauma or bony tenderness.     Lumbar back: Normal.     Comments: Tenderness to palpation to paraspinal region at approximately T1-3.  No tenderness to palpation directly over spine.  No tenderness or bony tenderness to palpation to left upper extremity.  No bruising or swelling noted to left upper extremity.  Neurological:     General: No focal deficit present.     Mental Status: She is alert and oriented to person, place, and time. Mental status is at baseline.  Psychiatric:        Mood and Affect: Mood normal.        Behavior: Behavior normal.        Thought Content: Thought content normal.        Judgment: Judgment normal.     UC Treatments / Results  Labs (all labs ordered are listed, but only abnormal results are displayed) Labs Reviewed - No data to display  EKG   Radiology DG Ribs Unilateral W/Chest Left  Result Date: 01/24/2021 CLINICAL DATA:  Fall several days ago with left-sided chest pain, initial encounter EXAM: LEFT RIBS AND CHEST - 3+ VIEW COMPARISON:  09/18/2013 FINDINGS: Cardiac shadow is within normal limits. Lungs are well aerated bilaterally. No focal infiltrate or sizable effusion is seen. Old healed rib fractures are noted on the left. No acute displaced or deforming rib fracture is seen at this time. IMPRESSION: No evidence of acute rib fracture. Electronically Signed   By: Alcide CleverMark  Lukens M.D.   On: 01/24/2021 17:30    Procedures Procedures (including critical care time)  Medications Ordered in UC Medications - No data to display  Initial Impression / Assessment and Plan / UC Course  I have reviewed the triage vital  signs and the nursing notes.  Pertinent labs & imaging results that were available during my care of the patient were reviewed by me and considered in my medical decision making (see chart for details).     Left rib x-ray was negative for any acute fractures.  Advised patient to do deep breathing exercises to prevent pneumonia.  Suspect contusion to ribs causing pain.  Advised patient to take Tylenol as needed for pain due to patient not being able to take ibuprofen.  Also use ice application to affected area of pain.  Do not think spinal x-ray is necessary due to tenderness to palpation and pain at paraspinal muscles.  Suspect thoracic muscle strain.  Left arm normal on physical exam.  Do not think a left arm x-ray is necessary at this time either due to full range of motion of arm and no physical abnormalities.  Patient to alternate ice and heat application to affected area of back.  Patient to follow-up with PCP, pain clinic, spine center if pain persists.Discussed strict return precautions. Patient verbalized understanding and is agreeable with plan.  Final Clinical Impressions(s) / UC Diagnoses   Final diagnoses:  Rib contusion, left, initial encounter  Left arm pain  Strain of thoracic region, initial encounter  Fall, initial encounter  Discharge Instructions      Your rib x-ray was negative for any fractures.  Please use ice application to affected area of pain.  You may alternate ice and heat application to affected area of pain on the back.  Take Tylenol as needed for pain as you are unable to take ibuprofen.  Please follow-up with primary care physician if needed if pain persists.     ED Prescriptions   None    PDMP not reviewed this encounter.   Lance Muss, FNP 01/24/21 1759

## 2021-01-24 NOTE — ED Triage Notes (Signed)
Pt c/o rib pain under left arm and sternum. States last weekend she fell and has had general pain and discomfort but the rib discomfort is getting "worse not better." Described 7/10 "sharp and dull" on arm, tingling fingers, sharp pain mid back. States tried tylenol, oxycodone, and motrin at home without relief.

## 2021-06-26 ENCOUNTER — Telehealth: Payer: Self-pay | Admitting: Internal Medicine

## 2021-06-26 NOTE — Telephone Encounter (Signed)
Scheduled appt per 1/9 referral. Pt is aware of appt date and time. Pt is aware to arrive 15 mins prior to appt time.  °

## 2021-07-07 ENCOUNTER — Other Ambulatory Visit: Payer: Self-pay

## 2021-07-07 DIAGNOSIS — D509 Iron deficiency anemia, unspecified: Secondary | ICD-10-CM

## 2021-07-08 ENCOUNTER — Telehealth: Payer: Self-pay | Admitting: Pharmacy Technician

## 2021-07-08 ENCOUNTER — Inpatient Hospital Stay: Payer: No Typology Code available for payment source

## 2021-07-08 ENCOUNTER — Encounter: Payer: Self-pay | Admitting: Internal Medicine

## 2021-07-08 ENCOUNTER — Inpatient Hospital Stay: Payer: No Typology Code available for payment source | Attending: Internal Medicine | Admitting: Internal Medicine

## 2021-07-08 ENCOUNTER — Other Ambulatory Visit: Payer: Self-pay

## 2021-07-08 ENCOUNTER — Other Ambulatory Visit: Payer: Self-pay | Admitting: Pharmacy Technician

## 2021-07-08 VITALS — BP 154/91 | HR 73 | Temp 98.3°F | Resp 18 | Ht 63.5 in | Wt 154.7 lb

## 2021-07-08 DIAGNOSIS — D509 Iron deficiency anemia, unspecified: Secondary | ICD-10-CM | POA: Insufficient documentation

## 2021-07-08 DIAGNOSIS — K909 Intestinal malabsorption, unspecified: Secondary | ICD-10-CM | POA: Diagnosis present

## 2021-07-08 DIAGNOSIS — D539 Nutritional anemia, unspecified: Secondary | ICD-10-CM

## 2021-07-08 DIAGNOSIS — E039 Hypothyroidism, unspecified: Secondary | ICD-10-CM | POA: Diagnosis not present

## 2021-07-08 DIAGNOSIS — Z87891 Personal history of nicotine dependence: Secondary | ICD-10-CM | POA: Insufficient documentation

## 2021-07-08 DIAGNOSIS — Z9884 Bariatric surgery status: Secondary | ICD-10-CM | POA: Diagnosis not present

## 2021-07-08 DIAGNOSIS — Z79899 Other long term (current) drug therapy: Secondary | ICD-10-CM | POA: Insufficient documentation

## 2021-07-08 DIAGNOSIS — M797 Fibromyalgia: Secondary | ICD-10-CM | POA: Diagnosis not present

## 2021-07-08 LAB — CMP (CANCER CENTER ONLY)
ALT: 60 U/L — ABNORMAL HIGH (ref 0–44)
AST: 26 U/L (ref 15–41)
Albumin: 3.6 g/dL (ref 3.5–5.0)
Alkaline Phosphatase: 141 U/L — ABNORMAL HIGH (ref 38–126)
Anion gap: 5 (ref 5–15)
BUN: 19 mg/dL (ref 6–20)
CO2: 27 mmol/L (ref 22–32)
Calcium: 8.8 mg/dL — ABNORMAL LOW (ref 8.9–10.3)
Chloride: 109 mmol/L (ref 98–111)
Creatinine: 0.48 mg/dL (ref 0.44–1.00)
GFR, Estimated: >60 mL/min (ref >60)
Glucose, Bld: 83 mg/dL (ref 70–99)
Potassium: 4 mmol/L (ref 3.5–5.1)
Sodium: 141 mmol/L (ref 135–145)
Total Bilirubin: 0.3 mg/dL (ref 0.3–1.2)
Total Protein: 6.4 g/dL — ABNORMAL LOW (ref 6.5–8.1)

## 2021-07-08 LAB — IRON AND IRON BINDING CAPACITY (CC-WL,HP ONLY)
Iron: 55 ug/dL (ref 28–170)
Saturation Ratios: 14 % (ref 10.4–31.8)
TIBC: 398 ug/dL (ref 250–450)
UIBC: 343 ug/dL (ref 148–442)

## 2021-07-08 LAB — CBC WITH DIFFERENTIAL (CANCER CENTER ONLY)
Abs Immature Granulocytes: 0.02 10*3/uL (ref 0.00–0.07)
Basophils Absolute: 0.1 10*3/uL (ref 0.0–0.1)
Basophils Relative: 1 %
Eosinophils Absolute: 0.3 10*3/uL (ref 0.0–0.5)
Eosinophils Relative: 5 %
HCT: 34.9 % — ABNORMAL LOW (ref 36.0–46.0)
Hemoglobin: 11.5 g/dL — ABNORMAL LOW (ref 12.0–15.0)
Immature Granulocytes: 0 %
Lymphocytes Relative: 31 %
Lymphs Abs: 1.8 10*3/uL (ref 0.7–4.0)
MCH: 29.6 pg (ref 26.0–34.0)
MCHC: 33 g/dL (ref 30.0–36.0)
MCV: 89.9 fL (ref 80.0–100.0)
Monocytes Absolute: 0.4 10*3/uL (ref 0.1–1.0)
Monocytes Relative: 7 %
Neutro Abs: 3.3 10*3/uL (ref 1.7–7.7)
Neutrophils Relative %: 56 %
Platelet Count: 237 10*3/uL (ref 150–400)
RBC: 3.88 MIL/uL (ref 3.87–5.11)
RDW: 13.2 % (ref 11.5–15.5)
WBC Count: 5.9 10*3/uL (ref 4.0–10.5)
nRBC: 0 % (ref 0.0–0.2)

## 2021-07-08 LAB — FOLATE: Folate: 10.1 ng/mL (ref >5.9)

## 2021-07-08 LAB — VITAMIN B12: Vitamin B-12: 366 pg/mL (ref 180–914)

## 2021-07-08 LAB — FERRITIN: Ferritin: 21 ng/mL (ref 11–307)

## 2021-07-08 NOTE — Telephone Encounter (Signed)
Dr. Arbutus Ped,  Lorain Childes note: Auth Submission: no auth needed Payer: [employer] Medication & CPT/J Code(s) submitted: Venofer (Iron Sucrose) [id number] Route of submission (phone, fax, portal): PHONE Auth type: Buy/Bill Units/visits requested: JORDAN-B. Reference number:  Approval from: 07/08/21 to 10/06/21   Patient will be scheduled as soon as possible.  Selena Batten

## 2021-07-08 NOTE — Progress Notes (Signed)
[facility] [phone]) [phone]   [phone]  CONSULT NOTE  REFERRING PHYSICIAN: Dr. Nita Sells  REASON FOR CONSULTATION:  56 years old white female with iron deficiency anemia  HPI Jessica Nunez is a 56 y.o. female with past medical history significant for fibromyalgia, ADHD, hypothyroidism as well as history of iron deficiency anemia for several years.  The patient is status post a gastric bypass surgery in 2006.  She was treated in the past with iron infusion under the care of Dr. Mariel Sleet at [facility] in 2014.  Followed by a hematologist in [facility].  She received iron infusion in 2021.  Her ferritin at that time was 3.8.  She was seen recently by her primary care physician and was found to have persistent anemia with low ferritin level.  The patient was referred to me today for evaluation and recommendation regarding treatment of her condition.  She has been on oral iron tablet with ferrous sulfate 325 mg p.o. daily with no improvement of her anemia. When seen today she continues to complain of fatigue and she sleeps a lot.  She also has occasional dizzy spells.  She denied having any shortness of breath except with exertion.  She has intermittent nausea and vomiting.  She intentionally lost around 25 pounds in the last few months.  The patient has no chest pain, cough or hemoptysis.  She has no diarrhea or constipation.  No abdominal pain.  She has no headache or visual changes. Family history significant for mother with diabetes mellitus and heart disease.  Father and 2 brothers had prostate cancer. The patient is married and has no children.  She works as a Scientific laboratory technician.  She has a history of smoking for around 40 years and she is trying to quit.  She has no history of alcohol or drug abuse.  HPI  Past Medical History:  Diagnosis Date   ADHD (attention deficit hyperactivity disorder)    Fibromyalgia    Hypothyroid    Iron  deficiency    Iron deficiency anemia 05/10/2012   Tachycardia     Past Surgical History:  Procedure Laterality Date   BACK SURGERY     CHOLECYSTECTOMY     COLONOSCOPY  04/14/12   ESOPHAGOGASTRODUODENOSCOPY  04/14/12   GASTRIC BYPASS     KNEE ARTHROSCOPY     right   LASIK      Family History  Problem Relation Age of Onset   Diabetes Mother    Heart disease Mother    Cancer Father    Diabetes Father    Heart disease Father    Hyperlipidemia Father    Cancer Brother        prostate   Heart attack Brother    Nephrotic syndrome Maternal Grandmother    Heart attack Brother     Social History Social History   Tobacco Use   Smoking status: Former    Packs/day: 0.15    Types: Cigarettes   Smokeless tobacco: Never  Substance Use Topics   Alcohol use: No   Drug use: No    Allergies  Allergen Reactions   Hydrocodone Hives    Current Outpatient Medications  Medication Sig Dispense Refill   acetaminophen (TYLENOL) 325 MG tablet Take 650 mg by mouth every 6 (six) hours as needed for pain.     amLODipine (NORVASC) 5 MG tablet TAKE 1 TABLET(5 MG) BY MOUTH DAILY 30 tablet 0   amLODipine (NORVASC) 5  MG tablet TAKE 1 TABLET(5 MG) BY MOUTH DAILY 30 tablet 0   atenolol (TENORMIN) 50 MG tablet TAKE 1 TABLET BY MOUTH TWICE DAILY 180 tablet 0   buPROPion (WELLBUTRIN SR) 150 MG 12 hr tablet TAKE 1 TABLET BY MOUTH TWICE DAILY 60 tablet 0   buPROPion (WELLBUTRIN XL) 150 MG 24 hr tablet Take 150 mg by mouth daily.     citalopram (CELEXA) 40 MG tablet Take 40 mg by mouth daily.     cyclobenzaprine (FLEXERIL) 10 MG tablet TAKE 1 TABLET(10 MG) BY MOUTH AT BEDTIME 90 tablet 0   diphenhydrAMINE (BENADRYL) 25 mg capsule Take 25 mg by mouth every 6 (six) hours as needed for itching.     ferrous sulfate 325 (65 FE) MG tablet Take 325 mg by mouth daily.      furosemide (LASIX) 20 MG tablet Take 20 mg by mouth daily.     LACTOBACILLUS BIFIDUS PO Take by mouth.     magnesium citrate SOLN Take  1 Bottle by mouth once.     Multiple Vitamins-Minerals (MULTIVITAMIN PO) Take 1 tablet by mouth daily.     NP THYROID 60 MG tablet TAKE 1 TABLET(60 MG) BY MOUTH DAILY BEFORE BREAKFAST 30 tablet 0   omeprazole (PRILOSEC) 20 MG capsule TAKE 1 CAPSULE BY MOUTH EVERY DAY 30 capsule 0   oxyCODONE-acetaminophen (PERCOCET) 7.5-325 MG tablet Take 1 tablet by mouth every 8 (eight) hours as needed for severe pain.     SENNOSIDES PO Take 1 tablet by mouth as needed.     Vitamin D, Ergocalciferol, (DRISDOL) 50000 UNITS CAPS capsule TAKE 1 CAPSULE BY MOUTH ONCE A WEEK 12 capsule 0   No current facility-administered medications for this visit.    Review of Systems  Constitutional: positive for fatigue and weight loss Eyes: negative Ears, nose, mouth, throat, and face: negative Respiratory: positive for dyspnea on exertion Cardiovascular: negative Gastrointestinal: negative Genitourinary:negative Integument/breast: negative Hematologic/lymphatic: negative Musculoskeletal:negative Neurological: negative Behavioral/Psych: negative Endocrine: negative Allergic/Immunologic: negative  Physical Exam  VVO:HYWVPRAL:alert, healthy, no distress, well nourished, and well developed SKIN: skin color, texture, turgor are normal, no rashes or significant lesions HEAD: Normocephalic, No masses, lesions, tenderness or abnormalities EYES: normal, PERRLA, Conjunctiva are pink and non-injected EARS: External ears normal, Canals clear OROPHARYNX:no exudate, no erythema, and lips, buccal mucosa, and tongue normal  NECK: supple, no adenopathy, no JVD LYMPH:  no palpable lymphadenopathy, no hepatosplenomegaly BREAST:not examined LUNGS: clear to auscultation , and palpation HEART: regular rate & rhythm, no murmurs, and no gallops ABDOMEN:abdomen soft, non-tender, normal bowel sounds, and no masses or organomegaly BACK: Back symmetric, no curvature., No CVA tenderness EXTREMITIES:no joint deformities, effusion, or  inflammation, no edema  NEURO: alert & oriented x 3 with fluent speech, no focal motor/sensory deficits  PERFORMANCE STATUS: ECOG 1  LABORATORY DATA: Lab Results  Component Value Date   WBC 7.3 09/13/2012   HGB 14.2 09/13/2012   HCT 42.0 09/13/2012   MCV 90.1 09/13/2012   PLT 278 09/13/2012      Chemistry      Component Value Date/Time   NA 143 01/09/2015 0900   K 4.4 01/09/2015 0900   CL 105 01/09/2015 0900   CO2 25 01/09/2015 0900   BUN 10 01/09/2015 0900   CREATININE 0.63 01/09/2015 0900      Component Value Date/Time   CALCIUM 7.9 (L) 01/09/2015 0900   ALKPHOS 132 (H) 01/09/2015 0900   AST 24 01/09/2015 0900   ALT 29 01/09/2015 0900  BILITOT 0.2 01/09/2015 0900       RADIOGRAPHIC STUDIES: No results found.  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 The patient is currently on oral iron tablets with no improvement in her anemia.   PLAN: I had a lengthy discussion with the patient today about her current condition and treatment options. Repeat CBC today showed persistent anemia with hemoglobin of 11.5.  The patient has low normal serum ferritin as well as serum iron and saturation. She has normal serum folate and vitamin B12 level. The patient is currently asymptomatic.  I recommended for the patient to proceed with iron infusion with Venofer 300 mg IV weekly for 3 weeks at the [facility]. I will see the patient back for follow-up visit in 3 months for evaluation with repeat CBC, iron study, ferritin as well as vitamin B12 level. She will continue with the ferrous sulfate 325 mg p.o. daily as maintenance.  The patient was advised to call immediately if she has any other concerning symptoms in the interval.  The patient voices understanding of current disease status and treatment options and is in agreement with the current care  plan.  All questions were answered. The patient knows to call the clinic with any problems, questions or concerns. We can certainly see the patient much sooner if necessary.  Thank you so much for allowing me to participate in the care of Jessica Nunez. I will continue to follow up the patient with you and assist in her care.  The total time spent in the appointment was 60 minutes.  Disclaimer: This note was dictated with voice recognition software. Similar sounding words can inadvertently be transcribed and may not be corrected upon review.   Lajuana Matte July 08, 2021, 11:45 AM

## 2021-07-10 LAB — PROTEIN ELECTROPHORESIS, SERUM, WITH REFLEX
A/G Ratio: 1.1 (ref 0.7–1.7)
Albumin ELP: 3.2 g/dL (ref 2.9–4.4)
Alpha-1-Globulin: 0.2 g/dL (ref 0.0–0.4)
Alpha-2-Globulin: 0.6 g/dL (ref 0.4–1.0)
Beta Globulin: 0.8 g/dL (ref 0.7–1.3)
Gamma Globulin: 1 g/dL (ref 0.4–1.8)
Globulin, Total: 2.8 g/dL (ref 2.2–3.9)
Total Protein ELP: 6 g/dL (ref 6.0–8.5)

## 2021-07-11 ENCOUNTER — Other Ambulatory Visit: Payer: Self-pay

## 2021-07-11 ENCOUNTER — Ambulatory Visit (INDEPENDENT_AMBULATORY_CARE_PROVIDER_SITE_OTHER): Payer: No Typology Code available for payment source

## 2021-07-11 VITALS — BP 121/73 | HR 69 | Temp 97.7°F | Resp 18

## 2021-07-11 DIAGNOSIS — D509 Iron deficiency anemia, unspecified: Secondary | ICD-10-CM

## 2021-07-11 MED ORDER — SODIUM CHLORIDE 0.9 % IV SOLN
300.0000 mg | Freq: Once | INTRAVENOUS | Status: AC
  Administered 2021-07-11: 300 mg via INTRAVENOUS
  Filled 2021-07-11: qty 15

## 2021-07-11 MED ORDER — EPINEPHRINE 0.3 MG/0.3ML IJ SOAJ: 0.3000 mg | Freq: Once | INTRAMUSCULAR | Status: DC | PRN

## 2021-07-11 MED ORDER — ALBUTEROL SULFATE HFA 108 (90 BASE) MCG/ACT IN AERS: 2.0000 | INHALATION_SPRAY | Freq: Once | RESPIRATORY_TRACT | Status: DC | PRN

## 2021-07-11 MED ORDER — SODIUM CHLORIDE 0.9 % IV SOLN: Freq: Once | INTRAVENOUS | Status: DC | PRN

## 2021-07-11 MED ORDER — DIPHENHYDRAMINE HCL 50 MG/ML IJ SOLN: 50.0000 mg | Freq: Once | INTRAMUSCULAR | Status: DC | PRN

## 2021-07-11 MED ORDER — METHYLPREDNISOLONE SODIUM SUCC 125 MG IJ SOLR: 125.0000 mg | Freq: Once | INTRAMUSCULAR | Status: DC | PRN

## 2021-07-11 MED ORDER — FAMOTIDINE IN NACL 20-0.9 MG/50ML-% IV SOLN: 20.0000 mg | Freq: Once | INTRAVENOUS | Status: DC | PRN

## 2021-07-11 NOTE — Progress Notes (Addendum)
Diagnosis: Iron Deficiency Anemia  Provider:  Chilton Greathouse, MD  Procedure: Infusion  IV Type: Peripheral, IV Location: L Hand  Venofer (Iron Sucrose), Dose: 300 mg  Infusion Start Time: 1206  Infusion Stop Time: 1346  Post Infusion IV Care: Patient declined observation and Peripheral IV Discontinued  Discharge: Condition: Good, Destination: Home . AVS provided to patient.   Performed by:  Garnette Czech, RN

## 2021-07-17 ENCOUNTER — Encounter: Payer: Self-pay | Admitting: Internal Medicine

## 2021-07-18 ENCOUNTER — Ambulatory Visit (INDEPENDENT_AMBULATORY_CARE_PROVIDER_SITE_OTHER): Payer: No Typology Code available for payment source

## 2021-07-18 ENCOUNTER — Other Ambulatory Visit: Payer: Self-pay

## 2021-07-18 VITALS — BP 110/65 | HR 72 | Temp 98.3°F | Resp 18 | Ht 62.0 in | Wt 146.0 lb

## 2021-07-18 DIAGNOSIS — D509 Iron deficiency anemia, unspecified: Secondary | ICD-10-CM

## 2021-07-18 MED ORDER — DIPHENHYDRAMINE HCL 50 MG/ML IJ SOLN: 50.0000 mg | Freq: Once | INTRAMUSCULAR | Status: DC | PRN

## 2021-07-18 MED ORDER — METHYLPREDNISOLONE SODIUM SUCC 125 MG IJ SOLR: 125.0000 mg | Freq: Once | INTRAMUSCULAR | Status: DC | PRN

## 2021-07-18 MED ORDER — SODIUM CHLORIDE 0.9 % IV SOLN: Freq: Once | INTRAVENOUS | Status: DC | PRN

## 2021-07-18 MED ORDER — SODIUM CHLORIDE 0.9 % IV SOLN
300.0000 mg | Freq: Once | INTRAVENOUS | Status: AC
  Administered 2021-07-18: 300 mg via INTRAVENOUS
  Filled 2021-07-18: qty 15

## 2021-07-18 MED ORDER — ALBUTEROL SULFATE HFA 108 (90 BASE) MCG/ACT IN AERS: 2.0000 | INHALATION_SPRAY | Freq: Once | RESPIRATORY_TRACT | Status: DC | PRN

## 2021-07-18 MED ORDER — FAMOTIDINE IN NACL 20-0.9 MG/50ML-% IV SOLN: 20.0000 mg | Freq: Once | INTRAVENOUS | Status: DC | PRN

## 2021-07-18 MED ORDER — EPINEPHRINE 0.3 MG/0.3ML IJ SOAJ: 0.3000 mg | Freq: Once | INTRAMUSCULAR | Status: DC | PRN

## 2021-07-18 NOTE — Progress Notes (Signed)
Diagnosis: Iron Deficiency Anemia  Provider:  Chilton Greathouse, MD  Procedure: Infusion  IV Type: Peripheral, IV Location: R Antecubital  Venofer (Iron Sucrose), Dose: 300 mg  Infusion Start Time: 1158  Infusion Stop Time: 1345  Post Infusion IV Care: Peripheral IV Discontinued  Discharge: Condition: Good, Destination: Home . AVS provided to patient.   Performed by:  Adriana Mccallum, RN

## 2021-07-25 ENCOUNTER — Encounter: Payer: Self-pay | Admitting: Internal Medicine

## 2021-07-25 ENCOUNTER — Ambulatory Visit (INDEPENDENT_AMBULATORY_CARE_PROVIDER_SITE_OTHER): Payer: No Typology Code available for payment source

## 2021-07-25 ENCOUNTER — Other Ambulatory Visit: Payer: Self-pay

## 2021-07-25 VITALS — BP 121/77 | HR 72 | Temp 98.2°F | Resp 18 | Ht 62.0 in | Wt 147.2 lb

## 2021-07-25 DIAGNOSIS — D509 Iron deficiency anemia, unspecified: Secondary | ICD-10-CM

## 2021-07-25 MED ORDER — ALBUTEROL SULFATE HFA 108 (90 BASE) MCG/ACT IN AERS: 2.0000 | INHALATION_SPRAY | Freq: Once | RESPIRATORY_TRACT | Status: DC | PRN

## 2021-07-25 MED ORDER — METHYLPREDNISOLONE SODIUM SUCC 125 MG IJ SOLR: 125.0000 mg | Freq: Once | INTRAMUSCULAR | Status: DC | PRN

## 2021-07-25 MED ORDER — DIPHENHYDRAMINE HCL 50 MG/ML IJ SOLN: 50.0000 mg | Freq: Once | INTRAMUSCULAR | Status: DC | PRN

## 2021-07-25 MED ORDER — EPINEPHRINE 0.3 MG/0.3ML IJ SOAJ: 0.3000 mg | Freq: Once | INTRAMUSCULAR | Status: DC | PRN

## 2021-07-25 MED ORDER — SODIUM CHLORIDE 0.9 % IV SOLN
300.0000 mg | Freq: Once | INTRAVENOUS | Status: AC
  Administered 2021-07-25: 300 mg via INTRAVENOUS
  Filled 2021-07-25: qty 15

## 2021-07-25 MED ORDER — SODIUM CHLORIDE 0.9 % IV SOLN: Freq: Once | INTRAVENOUS | Status: DC | PRN

## 2021-07-25 MED ORDER — FAMOTIDINE IN NACL 20-0.9 MG/50ML-% IV SOLN: 20.0000 mg | Freq: Once | INTRAVENOUS | Status: DC | PRN

## 2021-07-25 NOTE — Progress Notes (Signed)
Diagnosis: Iron Deficiency Anemia  Provider:  Marshell Garfinkel, MD  Procedure: Infusion  IV Type: Peripheral, IV Location: R Forearm  Venofer (Iron Sucrose), Dose: 300 mg  Infusion Start Time: 11.27 07/25/2021  Infusion Stop Time: 13.10 07/25/2021  Post Infusion IV Care: Peripheral IV Discontinued  Discharge: Condition: Good, Destination: Home . AVS provided to patient.   Performed by:  Arnoldo Morale, RN

## 2021-10-07 ENCOUNTER — Inpatient Hospital Stay: Payer: No Typology Code available for payment source | Admitting: Internal Medicine

## 2021-10-07 ENCOUNTER — Inpatient Hospital Stay: Payer: No Typology Code available for payment source

## 2021-10-20 ENCOUNTER — Encounter: Payer: Self-pay | Admitting: Internal Medicine

## 2021-10-20 ENCOUNTER — Other Ambulatory Visit: Payer: Self-pay

## 2021-10-20 ENCOUNTER — Inpatient Hospital Stay: Payer: No Typology Code available for payment source | Admitting: Internal Medicine

## 2021-10-20 ENCOUNTER — Inpatient Hospital Stay: Payer: No Typology Code available for payment source | Attending: Internal Medicine

## 2021-10-20 VITALS — BP 115/71 | HR 66 | Temp 97.9°F | Resp 18 | Wt 150.4 lb

## 2021-10-20 DIAGNOSIS — D508 Other iron deficiency anemias: Secondary | ICD-10-CM | POA: Diagnosis present

## 2021-10-20 DIAGNOSIS — Z79899 Other long term (current) drug therapy: Secondary | ICD-10-CM | POA: Insufficient documentation

## 2021-10-20 DIAGNOSIS — K912 Postsurgical malabsorption, not elsewhere classified: Secondary | ICD-10-CM | POA: Insufficient documentation

## 2021-10-20 DIAGNOSIS — Z9884 Bariatric surgery status: Secondary | ICD-10-CM | POA: Insufficient documentation

## 2021-10-20 DIAGNOSIS — D509 Iron deficiency anemia, unspecified: Secondary | ICD-10-CM

## 2021-10-20 DIAGNOSIS — D539 Nutritional anemia, unspecified: Secondary | ICD-10-CM

## 2021-10-20 LAB — CBC WITH DIFFERENTIAL (CANCER CENTER ONLY)
Abs Immature Granulocytes: 0.01 10*3/uL (ref 0.00–0.07)
Basophils Absolute: 0.1 10*3/uL (ref 0.0–0.1)
Basophils Relative: 1 %
Eosinophils Absolute: 0.3 10*3/uL (ref 0.0–0.5)
Eosinophils Relative: 4 %
HCT: 40.2 % (ref 36.0–46.0)
Hemoglobin: 13 g/dL (ref 12.0–15.0)
Immature Granulocytes: 0 %
Lymphocytes Relative: 28 %
Lymphs Abs: 1.6 10*3/uL (ref 0.7–4.0)
MCH: 30.2 pg (ref 26.0–34.0)
MCHC: 32.3 g/dL (ref 30.0–36.0)
MCV: 93.3 fL (ref 80.0–100.0)
Monocytes Absolute: 0.4 10*3/uL (ref 0.1–1.0)
Monocytes Relative: 7 %
Neutro Abs: 3.5 10*3/uL (ref 1.7–7.7)
Neutrophils Relative %: 60 %
Platelet Count: 237 10*3/uL (ref 150–400)
RBC: 4.31 MIL/uL (ref 3.87–5.11)
RDW: 12.6 % (ref 11.5–15.5)
WBC Count: 5.9 10*3/uL (ref 4.0–10.5)
nRBC: 0 % (ref 0.0–0.2)

## 2021-10-20 LAB — FERRITIN: Ferritin: 128 ng/mL (ref 11–307)

## 2021-10-20 LAB — IRON AND IRON BINDING CAPACITY (CC-WL,HP ONLY)
Iron: 126 ug/dL (ref 28–170)
Saturation Ratios: 38 % — ABNORMAL HIGH (ref 10.4–31.8)
TIBC: 330 ug/dL (ref 250–450)
UIBC: 204 ug/dL (ref 148–442)

## 2021-10-20 LAB — VITAMIN B12: Vitamin B-12: 332 pg/mL (ref 180–914)

## 2021-10-20 NOTE — Progress Notes (Signed)
?    [facility] ?[phone]) [phone]   [phone] ? ?OFFICE PROGRESS NOTE ? ?Benita Stabile, MD ?[address] [place] [address] ?[place] [address] ? 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 ?PRIOR THERAPY: Iron infusion with Venofer 300 mg IV weekly for 3 weeks.  Completed in February 2023 ? ?CURRENT THERAPY: Ferrous sulfate 325 mg p.o. daily ? ?INTERVAL HISTORY: ?Jessica Nunez 56 y.o. female returns to the clinic today for follow-up visit.  The patient is feeling much better today with improvement of her fatigue.  She denied having any current chest pain, shortness of breath, cough or hemoptysis.  She denied having any fever or chills.  She has no nausea, vomiting, diarrhea or constipation.  She has no headache or visual changes.  She has no bleeding, bruises or ecchymosis.  She tolerated her previous iron infusion fairly well.  She is here today for evaluation and repeat CBC, iron study and ferritin. ? ?MEDICAL HISTORY: ?Past Medical History:  ?Diagnosis Date  ? ADHD (attention deficit hyperactivity disorder)   ? Fibromyalgia   ? Hypothyroid   ? Iron deficiency   ? Iron deficiency anemia 05/10/2012  ? Tachycardia   ? ? ?ALLERGIES:  is allergic to hydromorphone and hydrocodone. ? ?MEDICATIONS:  ?Current Outpatient Medications  ?Medication Sig Dispense Refill  ? buPROPion (WELLBUTRIN XL) 300 MG 24 hr tablet Take 300 mg by mouth daily.    ? citalopram (CELEXA) 40 MG tablet Take 40 mg by mouth daily.    ? diphenhydrAMINE (BENADRYL) 25 mg capsule Take 25 mg by mouth every 6 (six) hours as needed for itching.    ? ferrous sulfate 325 (65 FE) MG tablet Take 325 mg by mouth daily.     ? furosemide (LASIX) 20 MG tablet Take 20 mg by mouth daily.    ? Multiple Vitamins-Minerals (MULTIVITAMIN PO) Take 1 tablet by mouth daily.    ? omeprazole (PRILOSEC) 20 MG capsule TAKE 1 CAPSULE BY MOUTH EVERY  DAY 30 capsule 0  ? oxyCODONE-acetaminophen (PERCOCET) 7.5-325 MG tablet Take 1 tablet by mouth every 8 (eight) hours as needed for severe pain.    ? potassium chloride (KLOR-CON) 10 MEQ tablet Take 10 mEq by mouth daily.    ? SENNOSIDES PO Take 1 tablet by mouth as needed.    ? ?No current facility-administered medications for this visit.  ? ? ?SURGICAL HISTORY:  ?Past Surgical History:  ?Procedure Laterality Date  ? BACK SURGERY    ? CHOLECYSTECTOMY    ? COLONOSCOPY  04/14/12  ? ESOPHAGOGASTRODUODENOSCOPY  04/14/12  ? GASTRIC BYPASS    ? KNEE ARTHROSCOPY    ? right  ? LASIK    ? ? ?REVIEW OF SYSTEMS:  A comprehensive review of systems was negative.  ? ?PHYSICAL EXAMINATION: General appearance: alert, cooperative, and no distress ?Head: Normocephalic, without obvious abnormality, atraumatic ?Neck: no adenopathy, no JVD, supple, symmetrical, trachea midline, and thyroid not enlarged, symmetric, no tenderness/mass/nodules ?Lymph nodes: Cervical, supraclavicular, and axillary nodes normal. ?Resp: clear to auscultation bilaterally ?Back: symmetric, no curvature. ROM normal. No CVA tenderness. ?Cardio: regular rate and rhythm, S1, S2 normal, no murmur, click, rub or gallop ?GI: soft, non-tender; bowel sounds normal; no masses,  no organomegaly ?Extremities: extremities normal, atraumatic, no cyanosis or edema ? ?ECOG PERFORMANCE STATUS: 0 - Asymptomatic ? ?Blood pressure 115/71, pulse 66, temperature 97.9 ?F (36.6 ?C), temperature source Tympanic,  resp. rate 18, weight 150 lb 7 oz (68.2 kg), last menstrual period 04/05/2014, SpO2 96 %. ? ?LABORATORY DATA: ?Lab Results  ?Component Value Date  ? WBC 5.9 10/20/2021  ? HGB 13.0 10/20/2021  ? HCT 40.2 10/20/2021  ? MCV 93.3 10/20/2021  ? PLT 237 10/20/2021  ? ? ?  Chemistry   ?   ?Component Value Date/Time  ? NA 141 07/08/2021 1156  ? NA 143 01/09/2015 0900  ? K 4.0 07/08/2021 1156  ? CL 109 07/08/2021 1156  ? CO2 27 07/08/2021 1156  ? BUN 19 07/08/2021 1156  ? BUN 10  01/09/2015 0900  ? CREATININE 0.48 07/08/2021 1156  ?    ?Component Value Date/Time  ? CALCIUM 8.8 (L) 07/08/2021 1156  ? ALKPHOS 141 (H) 07/08/2021 1156  ? AST 26 07/08/2021 1156  ? ALT 60 (H) 07/08/2021 1156  ? BILITOT 0.3 07/08/2021 1156  ?  ? ? ? ?RADIOGRAPHIC STUDIES: ?No results found. ? ?ASSESSMENT AND PLAN: This is a very pleasant 56 years old white female with history of iron deficiency anemia secondary to gastric bypass surgery and lack of response to the oral iron tablets. ?The patient was treated with iron infusion with Venofer 300 mg IV weekly for 3 weeks completed in February 2023. ?She is feeling much better today. ?Repeat CBC today showed improvement of her hemoglobin and hematocrit.  Iron study and ferritin are still pending. ?I recommended for the patient to continue on observation with repeat CBC, iron study and ferritin and 6 months.  ?She was advised to call immediately if she has any other concerning issues in the interval. ?The patient voices understanding of current disease status and treatment options and is in agreement with the current care plan. ? ?All questions were answered. The patient knows to call the clinic with any problems, questions or concerns. We can certainly see the patient much sooner if necessary. ? ?Disclaimer: This note was dictated with voice recognition software. Similar sounding words can inadvertently be transcribed and may not be corrected upon review. ? ? ?  ?   ?

## 2022-04-23 ENCOUNTER — Other Ambulatory Visit: Payer: No Typology Code available for payment source

## 2022-04-23 ENCOUNTER — Ambulatory Visit: Payer: No Typology Code available for payment source | Admitting: Internal Medicine

## 2022-05-24 DIAGNOSIS — M25531 Pain in right wrist: Secondary | ICD-10-CM | POA: Diagnosis not present

## 2022-05-24 DIAGNOSIS — M79641 Pain in right hand: Secondary | ICD-10-CM | POA: Diagnosis not present

## 2022-05-24 DIAGNOSIS — R030 Elevated blood-pressure reading, without diagnosis of hypertension: Secondary | ICD-10-CM | POA: Diagnosis not present

## 2022-05-24 DIAGNOSIS — Z6829 Body mass index (BMI) 29.0-29.9, adult: Secondary | ICD-10-CM | POA: Diagnosis not present

## 2022-06-17 DIAGNOSIS — M48061 Spinal stenosis, lumbar region without neurogenic claudication: Secondary | ICD-10-CM | POA: Diagnosis not present

## 2022-06-17 DIAGNOSIS — F1120 Opioid dependence, uncomplicated: Secondary | ICD-10-CM | POA: Diagnosis not present

## 2022-06-17 DIAGNOSIS — M47812 Spondylosis without myelopathy or radiculopathy, cervical region: Secondary | ICD-10-CM | POA: Diagnosis not present

## 2022-06-19 DIAGNOSIS — H903 Sensorineural hearing loss, bilateral: Secondary | ICD-10-CM | POA: Diagnosis not present

## 2022-07-16 DIAGNOSIS — I10 Essential (primary) hypertension: Secondary | ICD-10-CM | POA: Diagnosis not present

## 2022-07-16 DIAGNOSIS — R7301 Impaired fasting glucose: Secondary | ICD-10-CM | POA: Diagnosis not present

## 2022-07-22 DIAGNOSIS — F411 Generalized anxiety disorder: Secondary | ICD-10-CM | POA: Diagnosis not present

## 2022-07-22 DIAGNOSIS — F909 Attention-deficit hyperactivity disorder, unspecified type: Secondary | ICD-10-CM | POA: Diagnosis not present

## 2022-07-22 DIAGNOSIS — R945 Abnormal results of liver function studies: Secondary | ICD-10-CM | POA: Diagnosis not present

## 2022-07-22 DIAGNOSIS — F331 Major depressive disorder, recurrent, moderate: Secondary | ICD-10-CM | POA: Diagnosis not present

## 2022-08-06 DIAGNOSIS — M810 Age-related osteoporosis without current pathological fracture: Secondary | ICD-10-CM | POA: Diagnosis not present

## 2022-08-25 DIAGNOSIS — M48061 Spinal stenosis, lumbar region without neurogenic claudication: Secondary | ICD-10-CM | POA: Diagnosis not present

## 2022-08-25 DIAGNOSIS — F1120 Opioid dependence, uncomplicated: Secondary | ICD-10-CM | POA: Diagnosis not present

## 2022-08-25 DIAGNOSIS — M5416 Radiculopathy, lumbar region: Secondary | ICD-10-CM | POA: Diagnosis not present

## 2022-08-25 DIAGNOSIS — Z6829 Body mass index (BMI) 29.0-29.9, adult: Secondary | ICD-10-CM | POA: Diagnosis not present

## 2022-08-25 DIAGNOSIS — M47812 Spondylosis without myelopathy or radiculopathy, cervical region: Secondary | ICD-10-CM | POA: Diagnosis not present

## 2022-08-26 DIAGNOSIS — L570 Actinic keratosis: Secondary | ICD-10-CM | POA: Diagnosis not present

## 2022-08-26 DIAGNOSIS — X32XXXA Exposure to sunlight, initial encounter: Secondary | ICD-10-CM | POA: Diagnosis not present

## 2022-09-08 DIAGNOSIS — Z124 Encounter for screening for malignant neoplasm of cervix: Secondary | ICD-10-CM | POA: Diagnosis not present

## 2022-09-08 DIAGNOSIS — Z01419 Encounter for gynecological examination (general) (routine) without abnormal findings: Secondary | ICD-10-CM | POA: Diagnosis not present

## 2022-09-08 DIAGNOSIS — Z6830 Body mass index (BMI) 30.0-30.9, adult: Secondary | ICD-10-CM | POA: Diagnosis not present

## 2022-09-08 DIAGNOSIS — Z1151 Encounter for screening for human papillomavirus (HPV): Secondary | ICD-10-CM | POA: Diagnosis not present

## 2022-09-08 DIAGNOSIS — Z1231 Encounter for screening mammogram for malignant neoplasm of breast: Secondary | ICD-10-CM | POA: Diagnosis not present

## 2022-09-22 DIAGNOSIS — Z803 Family history of malignant neoplasm of breast: Secondary | ICD-10-CM | POA: Diagnosis not present

## 2022-09-22 DIAGNOSIS — Z8041 Family history of malignant neoplasm of ovary: Secondary | ICD-10-CM | POA: Diagnosis not present

## 2022-09-29 DIAGNOSIS — R9389 Abnormal findings on diagnostic imaging of other specified body structures: Secondary | ICD-10-CM | POA: Diagnosis not present

## 2022-11-24 DIAGNOSIS — F1120 Opioid dependence, uncomplicated: Secondary | ICD-10-CM | POA: Diagnosis not present

## 2022-11-24 DIAGNOSIS — Z6828 Body mass index (BMI) 28.0-28.9, adult: Secondary | ICD-10-CM | POA: Diagnosis not present

## 2022-11-24 DIAGNOSIS — M47812 Spondylosis without myelopathy or radiculopathy, cervical region: Secondary | ICD-10-CM | POA: Diagnosis not present

## 2022-11-24 DIAGNOSIS — M48061 Spinal stenosis, lumbar region without neurogenic claudication: Secondary | ICD-10-CM | POA: Diagnosis not present

## 2023-01-27 DIAGNOSIS — Z136 Encounter for screening for cardiovascular disorders: Secondary | ICD-10-CM | POA: Diagnosis not present

## 2023-01-27 DIAGNOSIS — D509 Iron deficiency anemia, unspecified: Secondary | ICD-10-CM | POA: Diagnosis not present

## 2023-01-27 DIAGNOSIS — I10 Essential (primary) hypertension: Secondary | ICD-10-CM | POA: Diagnosis not present

## 2023-02-02 DIAGNOSIS — F909 Attention-deficit hyperactivity disorder, unspecified type: Secondary | ICD-10-CM | POA: Diagnosis not present

## 2023-02-02 DIAGNOSIS — J209 Acute bronchitis, unspecified: Secondary | ICD-10-CM | POA: Diagnosis not present

## 2023-02-02 DIAGNOSIS — F411 Generalized anxiety disorder: Secondary | ICD-10-CM | POA: Diagnosis not present

## 2023-02-02 DIAGNOSIS — F419 Anxiety disorder, unspecified: Secondary | ICD-10-CM | POA: Diagnosis not present

## 2023-02-02 DIAGNOSIS — F331 Major depressive disorder, recurrent, moderate: Secondary | ICD-10-CM | POA: Diagnosis not present

## 2023-02-02 DIAGNOSIS — Z0001 Encounter for general adult medical examination with abnormal findings: Secondary | ICD-10-CM | POA: Diagnosis not present

## 2023-02-02 DIAGNOSIS — Z0000 Encounter for general adult medical examination without abnormal findings: Secondary | ICD-10-CM | POA: Diagnosis not present

## 2023-02-24 DIAGNOSIS — X32XXXD Exposure to sunlight, subsequent encounter: Secondary | ICD-10-CM | POA: Diagnosis not present

## 2023-02-24 DIAGNOSIS — B078 Other viral warts: Secondary | ICD-10-CM | POA: Diagnosis not present

## 2023-02-24 DIAGNOSIS — L570 Actinic keratosis: Secondary | ICD-10-CM | POA: Diagnosis not present

## 2023-03-11 DIAGNOSIS — F1120 Opioid dependence, uncomplicated: Secondary | ICD-10-CM | POA: Diagnosis not present

## 2023-03-11 DIAGNOSIS — M48061 Spinal stenosis, lumbar region without neurogenic claudication: Secondary | ICD-10-CM | POA: Diagnosis not present

## 2023-04-20 DIAGNOSIS — H25093 Other age-related incipient cataract, bilateral: Secondary | ICD-10-CM | POA: Diagnosis not present

## 2023-04-20 DIAGNOSIS — H524 Presbyopia: Secondary | ICD-10-CM | POA: Diagnosis not present

## 2023-04-20 DIAGNOSIS — H18593 Other hereditary corneal dystrophies, bilateral: Secondary | ICD-10-CM | POA: Diagnosis not present

## 2023-06-14 DIAGNOSIS — M47812 Spondylosis without myelopathy or radiculopathy, cervical region: Secondary | ICD-10-CM | POA: Diagnosis not present

## 2023-06-14 DIAGNOSIS — F1120 Opioid dependence, uncomplicated: Secondary | ICD-10-CM | POA: Diagnosis not present

## 2023-06-14 DIAGNOSIS — M48061 Spinal stenosis, lumbar region without neurogenic claudication: Secondary | ICD-10-CM | POA: Diagnosis not present

## 2023-08-11 DIAGNOSIS — I10 Essential (primary) hypertension: Secondary | ICD-10-CM | POA: Diagnosis not present

## 2023-08-11 DIAGNOSIS — D509 Iron deficiency anemia, unspecified: Secondary | ICD-10-CM | POA: Diagnosis not present

## 2023-08-11 DIAGNOSIS — E559 Vitamin D deficiency, unspecified: Secondary | ICD-10-CM | POA: Diagnosis not present

## 2023-08-11 DIAGNOSIS — M810 Age-related osteoporosis without current pathological fracture: Secondary | ICD-10-CM | POA: Diagnosis not present

## 2023-08-11 DIAGNOSIS — E039 Hypothyroidism, unspecified: Secondary | ICD-10-CM | POA: Diagnosis not present

## 2023-08-11 DIAGNOSIS — E213 Hyperparathyroidism, unspecified: Secondary | ICD-10-CM | POA: Diagnosis not present

## 2023-08-11 DIAGNOSIS — Z9884 Bariatric surgery status: Secondary | ICD-10-CM | POA: Diagnosis not present

## 2023-08-18 ENCOUNTER — Other Ambulatory Visit (HOSPITAL_COMMUNITY): Payer: Self-pay | Admitting: Nurse Practitioner

## 2023-08-18 DIAGNOSIS — F331 Major depressive disorder, recurrent, moderate: Secondary | ICD-10-CM | POA: Diagnosis not present

## 2023-08-18 DIAGNOSIS — F909 Attention-deficit hyperactivity disorder, unspecified type: Secondary | ICD-10-CM | POA: Diagnosis not present

## 2023-08-18 DIAGNOSIS — K219 Gastro-esophageal reflux disease without esophagitis: Secondary | ICD-10-CM | POA: Diagnosis not present

## 2023-08-18 DIAGNOSIS — F329 Major depressive disorder, single episode, unspecified: Secondary | ICD-10-CM | POA: Diagnosis not present

## 2023-08-18 DIAGNOSIS — M25532 Pain in left wrist: Secondary | ICD-10-CM | POA: Diagnosis not present

## 2023-08-18 DIAGNOSIS — F411 Generalized anxiety disorder: Secondary | ICD-10-CM | POA: Diagnosis not present

## 2023-08-24 ENCOUNTER — Ambulatory Visit (HOSPITAL_COMMUNITY)
Admission: RE | Admit: 2023-08-24 | Discharge: 2023-08-24 | Disposition: A | Discharge status: Home / Self Care | Payer: Self-pay | Source: Ambulatory Visit | Attending: Nurse Practitioner | Admitting: Nurse Practitioner

## 2023-08-24 DIAGNOSIS — M25532 Pain in left wrist: Secondary | ICD-10-CM

## 2023-08-24 DIAGNOSIS — M7989 Other specified soft tissue disorders: Secondary | ICD-10-CM | POA: Diagnosis not present

## 2023-08-25 DIAGNOSIS — M810 Age-related osteoporosis without current pathological fracture: Secondary | ICD-10-CM | POA: Diagnosis not present

## 2023-09-14 DIAGNOSIS — M48061 Spinal stenosis, lumbar region without neurogenic claudication: Secondary | ICD-10-CM | POA: Diagnosis not present

## 2023-09-14 DIAGNOSIS — M47812 Spondylosis without myelopathy or radiculopathy, cervical region: Secondary | ICD-10-CM | POA: Diagnosis not present

## 2023-09-14 DIAGNOSIS — F1120 Opioid dependence, uncomplicated: Secondary | ICD-10-CM | POA: Diagnosis not present

## 2023-09-29 DIAGNOSIS — D508 Other iron deficiency anemias: Secondary | ICD-10-CM | POA: Diagnosis not present

## 2023-09-29 DIAGNOSIS — E559 Vitamin D deficiency, unspecified: Secondary | ICD-10-CM | POA: Diagnosis not present

## 2023-09-29 DIAGNOSIS — E782 Mixed hyperlipidemia: Secondary | ICD-10-CM | POA: Diagnosis not present

## 2023-09-29 DIAGNOSIS — R7301 Impaired fasting glucose: Secondary | ICD-10-CM | POA: Diagnosis not present

## 2023-09-29 DIAGNOSIS — Z9884 Bariatric surgery status: Secondary | ICD-10-CM | POA: Diagnosis not present

## 2023-09-29 DIAGNOSIS — E039 Hypothyroidism, unspecified: Secondary | ICD-10-CM | POA: Diagnosis not present

## 2023-09-29 DIAGNOSIS — Z0000 Encounter for general adult medical examination without abnormal findings: Secondary | ICD-10-CM | POA: Diagnosis not present

## 2023-09-29 DIAGNOSIS — F5105 Insomnia due to other mental disorder: Secondary | ICD-10-CM | POA: Diagnosis not present

## 2023-09-29 DIAGNOSIS — Z1330 Encounter for screening examination for mental health and behavioral disorders, unspecified: Secondary | ICD-10-CM | POA: Diagnosis not present

## 2023-09-29 DIAGNOSIS — F331 Major depressive disorder, recurrent, moderate: Secondary | ICD-10-CM | POA: Diagnosis not present

## 2023-10-01 ENCOUNTER — Telehealth: Payer: Self-pay | Admitting: Physician Assistant

## 2023-10-01 DIAGNOSIS — S63599A Other specified sprain of unspecified wrist, initial encounter: Secondary | ICD-10-CM | POA: Diagnosis not present

## 2023-10-01 DIAGNOSIS — M79642 Pain in left hand: Secondary | ICD-10-CM | POA: Diagnosis not present

## 2023-10-01 DIAGNOSIS — M25539 Pain in unspecified wrist: Secondary | ICD-10-CM | POA: Diagnosis not present

## 2023-10-01 NOTE — Telephone Encounter (Signed)
 Scheduled appointments per 4/18 staff message. The patient is aware and active on MyChart.

## 2023-10-12 NOTE — Progress Notes (Signed)
 [facility] OFFICE PROGRESS NOTE  Omie Bickers, MD [address] [place] [address]  DIAGNOSIS: persistent iron  deficiency anemia secondary to malabsorption secondary to gastric bypass surgery with no improvement in the oral iron  tablets   PRIOR THERAPY: Iron  infusion with Venofer  300 mg IV weekly for 3 weeks. Completed in February 10,2023   CURRENT THERAPY: Ferrous sulfate 325 mg p.o. daily   INTERVAL HISTORY: Jessica Nunez 58 y.o. female returns to the clinic today for a follow-up visit.  The patient was last seen in the clinic in May 2023. She was lost to follow-up since that time. The patient has a history of iron  deficiency anemia secondary to gastric bypass surgery.  She receives IV iron  as needed.  Her most recent was in February 2023. She does take an oral iron  supplement daily and is compliant. She recently had a follow-up with her PCP and there lab work demonstrated low ferritin at 23, B12 normal at 443, folate at 3.4,  normal TIBC, normal iron  at 76, normal saturation at 19. Her hemoglobin was normal at 13.5 for an annual physical.    Overall, she is having more fatigue. She craves yogurt and blueberries when she is iron  deficient per patient report. She also used to crave circus peanuts. She denies any shortness of breath, lightheadedness, or syncope.  Denies any abnormal bleeding or bruising.  She is not sure if she is up to date on her colonoscopy. She goes to [facility] med. She denies any fever, chills, night sweats, or unexplained weight loss.  She is here today for evaluation and to get reestablished.    MEDICAL HISTORY: Past Medical History:  Diagnosis Date   ADHD (attention deficit hyperactivity disorder)    Fibromyalgia    Hypothyroid    Iron  deficiency    Iron  deficiency anemia 05/10/2012   Tachycardia     ALLERGIES:  is allergic to hydromorphone and hydrocodone.  MEDICATIONS:  Current Outpatient Medications  Medication Sig Dispense Refill    buPROPion (WELLBUTRIN XL) 300 MG 24 hr tablet Take 300 mg by mouth daily.     cariprazine (VRAYLAR) 1.5 MG capsule Take 1.5 mg by mouth daily.     citalopram (CELEXA) 40 MG tablet Take 40 mg by mouth daily.     diphenhydrAMINE  (BENADRYL ) 25 mg capsule Take 25 mg by mouth every 6 (six) hours as needed for itching.     ferrous sulfate 325 (65 FE) MG tablet Take 325 mg by mouth daily.      furosemide (LASIX) 20 MG tablet Take 20 mg by mouth daily.     Multiple Vitamins-Minerals (MULTIVITAMIN PO) Take 1 tablet by mouth daily.     omeprazole (PRILOSEC) 20 MG capsule TAKE 1 CAPSULE BY MOUTH EVERY DAY 30 capsule 0   oxyCODONE-acetaminophen (PERCOCET) 7.5-325 MG tablet Take 1 tablet by mouth every 8 (eight) hours as needed for severe pain.     potassium chloride (KLOR-CON) 10 MEQ tablet Take 10 mEq by mouth daily.     SENNOSIDES PO Take 1 tablet by mouth as needed.     No current facility-administered medications for this visit.    SURGICAL HISTORY:  Past Surgical History:  Procedure Laterality Date   BACK SURGERY     CHOLECYSTECTOMY     COLONOSCOPY  04/14/12   ESOPHAGOGASTRODUODENOSCOPY  04/14/12   GASTRIC BYPASS     KNEE ARTHROSCOPY     right   LASIK      REVIEW OF SYSTEMS:  Review of Systems  Constitutional: Positive for fatigue. Negative for appetite change, chills, fever and unexpected weight change.  HENT:   Negative for mouth sores, nosebleeds, sore throat and trouble swallowing.   Eyes: Negative for eye problems and icterus.  Respiratory: Negative for cough, hemoptysis, shortness of breath and wheezing.   Cardiovascular: Negative for chest pain and leg swelling.  Gastrointestinal: Negative for abdominal pain, constipation, diarrhea, nausea and vomiting.  Genitourinary: Negative for bladder incontinence, difficulty urinating, dysuria, frequency and hematuria.   Musculoskeletal: Negative for back pain, gait problem, neck pain and neck stiffness.  Skin: Negative for itching and  rash.  Neurological: Negative for dizziness, extremity weakness, gait problem, headaches, light-headedness and seizures.  Hematological: Negative for adenopathy. Does not bruise/bleed easily.  Psychiatric/Behavioral: Negative for confusion, depression and sleep disturbance. The patient is not nervous/anxious.     PHYSICAL EXAMINATION:  Blood pressure 132/81, pulse 78, temperature 97.8 F (36.6 C), temperature source Temporal, resp. rate 14, weight 164 lb (74.4 kg), last menstrual period 04/05/2014, SpO2 97%.  ECOG PERFORMANCE STATUS: 1  Physical Exam  Constitutional: Oriented to person, place, and time and well-developed, well-nourished, and in no distress.   HENT:  Head: Normocephalic and atraumatic.  Mouth/Throat: Oropharynx is clear and moist. No oropharyngeal exudate.  Eyes: Conjunctivae are normal. Right eye exhibits no discharge. Left eye exhibits no discharge. No scleral icterus.  Neck: Normal range of motion. Neck supple.  Cardiovascular: Normal rate, regular rhythm, normal heart sounds and intact distal pulses.   Pulmonary/Chest: Effort normal and breath sounds normal. No respiratory distress. No wheezes. No rales.  Abdominal: Soft. Bowel sounds are normal. Exhibits no distension and no mass. There is no tenderness.  Musculoskeletal: Normal range of motion. Exhibits no edema.  Lymphadenopathy:    No cervical adenopathy.  Neurological: Alert and oriented to person, place, and time. Exhibits normal muscle tone. Gait normal. Coordination normal.  Skin: Skin is warm and dry. No rash noted. Not diaphoretic. No erythema. No pallor.  Psychiatric: Mood, memory and judgment normal.  Vitals reviewed.  LABORATORY DATA: Lab Results  Component Value Date   WBC 5.0 10/15/2023   HGB 12.7 10/15/2023   HCT 38.8 10/15/2023   MCV 95.6 10/15/2023   PLT 238 10/15/2023      Chemistry      Component Value Date/Time   NA 141 10/15/2023 0946   NA 143 01/09/2015 0900   K 4.3 10/15/2023  0946   CL 107 10/15/2023 0946   CO2 28 10/15/2023 0946   BUN 15 10/15/2023 0946   BUN 10 01/09/2015 0900   CREATININE 0.50 10/15/2023 0946      Component Value Date/Time   CALCIUM 8.1 (L) 10/15/2023 0946   ALKPHOS 84 10/15/2023 0946   AST 35 10/15/2023 0946   ALT 49 (H) 10/15/2023 0946   BILITOT 0.4 10/15/2023 0946       RADIOGRAPHIC STUDIES:  No results found.   ASSESSMENT/PLAN:  Is a very pleasant 58 year old Caucasian female with iron  deficiency anemia secondary to gastric bypass surgery.  She previously had lack of response to oral iron  supplements.  She was previously treated with IV iron  with Venofer  300 mg weekly x 3 the most recent dose on 07/25/2021.  The patient had repeat CBC, CMP, iron  studies, ferritin, and folate testing.  Her labs today showed normal CBC. Her calcium is low on CMP (she takes supplements and tums). Her folate, iron , and ferritin are pending.   Ferritin is still pending.  If this is  significantly low we will arrange for IV iron  infusions with Venofer  at the [facility] infusion center. I will send her a mychart message with her results.   Will see her back for labs and follow-up with Dr. Marguerita Shih in 4 months.  She will continue taking her iron  supplement p.o. daily  The patient was advised to call immediately if she has any concerning symptoms in the interval. The patient voices understanding of current disease status and treatment options and is in agreement with the current care plan. All questions were answered. The patient knows to call the clinic with any problems, questions or concerns. We can certainly see the patient much sooner if necessary   Orders Placed This Encounter  Procedures   CBC with Differential (Cancer Center Only)    Standing Status:   Future    Expected Date:   02/02/2024    Expiration Date:   10/14/2024   Ferritin    Standing Status:   Future    Expected Date:   02/09/2024    Expiration Date:   10/14/2024   Iron  and Iron   Binding Capacity ([facility] only)    Standing Status:   Future    Expected Date:   02/09/2024    Expiration Date:   10/14/2024     The total time spent in the appointment was 20-29 minutes  Jessica Carrithers L Dabid Godown, PA-C 10/15/23

## 2023-10-14 ENCOUNTER — Other Ambulatory Visit: Payer: Self-pay | Admitting: Physician Assistant

## 2023-10-14 DIAGNOSIS — D509 Iron deficiency anemia, unspecified: Secondary | ICD-10-CM

## 2023-10-15 ENCOUNTER — Encounter: Payer: Self-pay | Admitting: Physician Assistant

## 2023-10-15 ENCOUNTER — Inpatient Hospital Stay: Admitting: Physician Assistant

## 2023-10-15 ENCOUNTER — Inpatient Hospital Stay: Attending: Internal Medicine

## 2023-10-15 ENCOUNTER — Other Ambulatory Visit: Payer: Self-pay | Admitting: Physician Assistant

## 2023-10-15 VITALS — BP 132/81 | HR 78 | Temp 97.8°F | Resp 14 | Wt 164.0 lb

## 2023-10-15 DIAGNOSIS — E538 Deficiency of other specified B group vitamins: Secondary | ICD-10-CM

## 2023-10-15 DIAGNOSIS — D509 Iron deficiency anemia, unspecified: Secondary | ICD-10-CM | POA: Diagnosis not present

## 2023-10-15 DIAGNOSIS — Z9884 Bariatric surgery status: Secondary | ICD-10-CM | POA: Insufficient documentation

## 2023-10-15 DIAGNOSIS — K912 Postsurgical malabsorption, not elsewhere classified: Secondary | ICD-10-CM | POA: Insufficient documentation

## 2023-10-15 LAB — CBC WITH DIFFERENTIAL (CANCER CENTER ONLY)
Abs Immature Granulocytes: 0.01 10*3/uL (ref 0.00–0.07)
Basophils Absolute: 0.1 10*3/uL (ref 0.0–0.1)
Basophils Relative: 1 %
Eosinophils Absolute: 0.2 10*3/uL (ref 0.0–0.5)
Eosinophils Relative: 5 %
HCT: 38.8 % (ref 36.0–46.0)
Hemoglobin: 12.7 g/dL (ref 12.0–15.0)
Immature Granulocytes: 0 %
Lymphocytes Relative: 34 %
Lymphs Abs: 1.7 10*3/uL (ref 0.7–4.0)
MCH: 31.3 pg (ref 26.0–34.0)
MCHC: 32.7 g/dL (ref 30.0–36.0)
MCV: 95.6 fL (ref 80.0–100.0)
Monocytes Absolute: 0.4 10*3/uL (ref 0.1–1.0)
Monocytes Relative: 8 %
Neutro Abs: 2.6 10*3/uL (ref 1.7–7.7)
Neutrophils Relative %: 52 %
Platelet Count: 238 10*3/uL (ref 150–400)
RBC: 4.06 MIL/uL (ref 3.87–5.11)
RDW: 12.7 % (ref 11.5–15.5)
WBC Count: 5 10*3/uL (ref 4.0–10.5)
nRBC: 0 % (ref 0.0–0.2)

## 2023-10-15 LAB — CMP (CANCER CENTER ONLY)
ALT: 49 U/L — ABNORMAL HIGH (ref 0–44)
AST: 35 U/L (ref 15–41)
Albumin: 3.8 g/dL (ref 3.5–5.0)
Alkaline Phosphatase: 84 U/L (ref 38–126)
Anion gap: 6 (ref 5–15)
BUN: 15 mg/dL (ref 6–20)
CO2: 28 mmol/L (ref 22–32)
Calcium: 8.1 mg/dL — ABNORMAL LOW (ref 8.9–10.3)
Chloride: 107 mmol/L (ref 98–111)
Creatinine: 0.5 mg/dL (ref 0.44–1.00)
GFR, Estimated: >60 mL/min (ref >60)
Glucose, Bld: 86 mg/dL (ref 70–99)
Potassium: 4.3 mmol/L (ref 3.5–5.1)
Sodium: 141 mmol/L (ref 135–145)
Total Bilirubin: 0.4 mg/dL (ref 0.0–1.2)
Total Protein: 6.5 g/dL (ref 6.5–8.1)

## 2023-10-15 LAB — IRON AND IRON BINDING CAPACITY (CC-WL,HP ONLY)
Iron: 77 ug/dL (ref 28–170)
Saturation Ratios: 17 % (ref 10.4–31.8)
TIBC: 449 ug/dL (ref 250–450)
UIBC: 372 ug/dL (ref 148–442)

## 2023-10-15 LAB — FOLATE: Folate: 4.9 ng/mL — ABNORMAL LOW (ref >5.9)

## 2023-10-15 LAB — FERRITIN: Ferritin: 26 ng/mL (ref 11–307)

## 2023-10-15 MED ORDER — FOLIC ACID 1 MG PO TABS: 1.0000 mg | ORAL_TABLET | Freq: Every day | ORAL | 0 refills | Status: AC

## 2023-10-18 DIAGNOSIS — Z6830 Body mass index (BMI) 30.0-30.9, adult: Secondary | ICD-10-CM | POA: Diagnosis not present

## 2023-10-18 DIAGNOSIS — Z124 Encounter for screening for malignant neoplasm of cervix: Secondary | ICD-10-CM | POA: Diagnosis not present

## 2023-10-18 DIAGNOSIS — Z1231 Encounter for screening mammogram for malignant neoplasm of breast: Secondary | ICD-10-CM | POA: Diagnosis not present

## 2023-10-18 DIAGNOSIS — Z01419 Encounter for gynecological examination (general) (routine) without abnormal findings: Secondary | ICD-10-CM | POA: Diagnosis not present

## 2023-10-18 DIAGNOSIS — Z113 Encounter for screening for infections with a predominantly sexual mode of transmission: Secondary | ICD-10-CM | POA: Diagnosis not present

## 2023-10-18 DIAGNOSIS — Z1151 Encounter for screening for human papillomavirus (HPV): Secondary | ICD-10-CM | POA: Diagnosis not present

## 2023-10-21 ENCOUNTER — Ambulatory Visit: Admitting: Physician Assistant

## 2023-10-26 DIAGNOSIS — M79642 Pain in left hand: Secondary | ICD-10-CM | POA: Diagnosis not present

## 2023-10-27 DIAGNOSIS — E213 Hyperparathyroidism, unspecified: Secondary | ICD-10-CM | POA: Diagnosis not present

## 2023-10-27 DIAGNOSIS — F331 Major depressive disorder, recurrent, moderate: Secondary | ICD-10-CM | POA: Diagnosis not present

## 2023-10-27 DIAGNOSIS — E538 Deficiency of other specified B group vitamins: Secondary | ICD-10-CM | POA: Diagnosis not present

## 2023-10-27 DIAGNOSIS — F902 Attention-deficit hyperactivity disorder, combined type: Secondary | ICD-10-CM | POA: Diagnosis not present

## 2023-10-28 DIAGNOSIS — E213 Hyperparathyroidism, unspecified: Secondary | ICD-10-CM | POA: Diagnosis not present

## 2023-11-03 DIAGNOSIS — S63592A Other specified sprain of left wrist, initial encounter: Secondary | ICD-10-CM | POA: Diagnosis not present

## 2023-11-18 DIAGNOSIS — R7989 Other specified abnormal findings of blood chemistry: Secondary | ICD-10-CM | POA: Diagnosis not present

## 2023-11-19 ENCOUNTER — Other Ambulatory Visit: Payer: Self-pay | Admitting: Surgery

## 2023-11-19 DIAGNOSIS — R7989 Other specified abnormal findings of blood chemistry: Secondary | ICD-10-CM

## 2023-11-22 ENCOUNTER — Ambulatory Visit
Admission: RE | Admit: 2023-11-22 | Discharge: 2023-11-22 | Disposition: A | Discharge status: Home / Self Care | Source: Ambulatory Visit | Attending: Surgery | Admitting: Surgery

## 2023-11-22 DIAGNOSIS — R7989 Other specified abnormal findings of blood chemistry: Secondary | ICD-10-CM

## 2023-11-22 DIAGNOSIS — E213 Hyperparathyroidism, unspecified: Secondary | ICD-10-CM | POA: Diagnosis not present

## 2023-11-30 ENCOUNTER — Ambulatory Visit: Payer: Self-pay | Admitting: Surgery

## 2023-11-30 NOTE — Progress Notes (Signed)
 Ultrasound performed on November 22, 2023 shows a normal thyroid  gland and no evidence of parathyroid adenoma.  24-hour urine collection for calcium is in the normal range.  At this point there is no indication of primary hyperparathyroidism other than a mildly elevated intact PTH level.  I am not certain as to the etiology of this elevation.  It may be related to the patient's ongoing treatment for osteoporosis.  It may be related to the patient's previous gastric bypass surgery.  At this point I do not think the patient requires further radiographic evaluation.  I would not perform a nuclear medicine scan or cross-sectional imaging at this point.  I would recommend repeat intact PTH level and serum calcium level and 25-hydroxy vitamin D level which can all be performed at our reference laboratory in approximately 4 months.  I will ask my nurse to make arrangements for the studies and then plan to see the patient in the office for follow-up once the results are available.  Oralee Billow, MD [facility] Office: [phone]

## 2023-12-06 DIAGNOSIS — M47812 Spondylosis without myelopathy or radiculopathy, cervical region: Secondary | ICD-10-CM | POA: Diagnosis not present

## 2023-12-06 DIAGNOSIS — M48061 Spinal stenosis, lumbar region without neurogenic claudication: Secondary | ICD-10-CM | POA: Diagnosis not present

## 2023-12-06 DIAGNOSIS — F1120 Opioid dependence, uncomplicated: Secondary | ICD-10-CM | POA: Diagnosis not present

## 2023-12-29 DIAGNOSIS — F331 Major depressive disorder, recurrent, moderate: Secondary | ICD-10-CM | POA: Diagnosis not present

## 2023-12-29 DIAGNOSIS — F902 Attention-deficit hyperactivity disorder, combined type: Secondary | ICD-10-CM | POA: Diagnosis not present

## 2023-12-29 DIAGNOSIS — E039 Hypothyroidism, unspecified: Secondary | ICD-10-CM | POA: Diagnosis not present

## 2023-12-29 DIAGNOSIS — D508 Other iron deficiency anemias: Secondary | ICD-10-CM | POA: Diagnosis not present

## 2023-12-29 DIAGNOSIS — E213 Hyperparathyroidism, unspecified: Secondary | ICD-10-CM | POA: Diagnosis not present

## 2024-01-29 ENCOUNTER — Encounter (HOSPITAL_COMMUNITY): Payer: Self-pay

## 2024-01-29 ENCOUNTER — Emergency Department (HOSPITAL_COMMUNITY)
Admission: EM | Admit: 2024-01-29 | Discharge: 2024-01-29 | Disposition: A | Discharge status: Home / Self Care | Attending: Emergency Medicine | Admitting: Emergency Medicine

## 2024-01-29 ENCOUNTER — Other Ambulatory Visit: Payer: Self-pay

## 2024-01-29 DIAGNOSIS — R9431 Abnormal electrocardiogram [ECG] [EKG]: Secondary | ICD-10-CM | POA: Diagnosis not present

## 2024-01-29 DIAGNOSIS — T7840XA Allergy, unspecified, initial encounter: Secondary | ICD-10-CM | POA: Insufficient documentation

## 2024-01-29 DIAGNOSIS — R21 Rash and other nonspecific skin eruption: Secondary | ICD-10-CM | POA: Diagnosis not present

## 2024-01-29 LAB — COMPREHENSIVE METABOLIC PANEL WITH GFR
ALT: 54 U/L — ABNORMAL HIGH (ref 0–44)
AST: 43 U/L — ABNORMAL HIGH (ref 15–41)
Albumin: 3.1 g/dL — ABNORMAL LOW (ref 3.5–5.0)
Alkaline Phosphatase: 98 U/L (ref 38–126)
Anion gap: 9 (ref 5–15)
BUN: 14 mg/dL (ref 6–20)
CO2: 21 mmol/L — ABNORMAL LOW (ref 22–32)
Calcium: 7.8 mg/dL — ABNORMAL LOW (ref 8.9–10.3)
Chloride: 106 mmol/L (ref 98–111)
Creatinine, Ser: 0.74 mg/dL (ref 0.44–1.00)
GFR, Estimated: >60 mL/min (ref >60)
Glucose, Bld: 139 mg/dL — ABNORMAL HIGH (ref 70–99)
Potassium: 3.3 mmol/L — ABNORMAL LOW (ref 3.5–5.1)
Sodium: 136 mmol/L (ref 135–145)
Total Bilirubin: 0.6 mg/dL (ref 0.0–1.2)
Total Protein: 5.9 g/dL — ABNORMAL LOW (ref 6.5–8.1)

## 2024-01-29 LAB — CBC WITH DIFFERENTIAL/PLATELET
Abs Immature Granulocytes: 0.03 K/uL (ref 0.00–0.07)
Basophils Absolute: 0.1 K/uL (ref 0.0–0.1)
Basophils Relative: 1 %
Eosinophils Absolute: 0.1 K/uL (ref 0.0–0.5)
Eosinophils Relative: 2 %
HCT: 38.8 % (ref 36.0–46.0)
Hemoglobin: 12.5 g/dL (ref 12.0–15.0)
Immature Granulocytes: 0 %
Lymphocytes Relative: 30 %
Lymphs Abs: 2.5 K/uL (ref 0.7–4.0)
MCH: 29.9 pg (ref 26.0–34.0)
MCHC: 32.2 g/dL (ref 30.0–36.0)
MCV: 92.8 fL (ref 80.0–100.0)
Monocytes Absolute: 0.6 K/uL (ref 0.1–1.0)
Monocytes Relative: 8 %
Neutro Abs: 4.9 K/uL (ref 1.7–7.7)
Neutrophils Relative %: 59 %
Platelets: 266 K/uL (ref 150–400)
RBC: 4.18 MIL/uL (ref 3.87–5.11)
RDW: 13.6 % (ref 11.5–15.5)
WBC: 8.3 K/uL (ref 4.0–10.5)
nRBC: 0 % (ref 0.0–0.2)

## 2024-01-29 MED ORDER — FAMOTIDINE 20 MG PO TABS: 20.0000 mg | ORAL_TABLET | Freq: Two times a day (BID) | ORAL | 0 refills | Status: AC

## 2024-01-29 MED ORDER — FAMOTIDINE IN NACL 20-0.9 MG/50ML-% IV SOLN
20.0000 mg | Freq: Once | INTRAVENOUS | Status: AC
  Administered 2024-01-29: 20 mg via INTRAVENOUS
  Filled 2024-01-29: qty 50

## 2024-01-29 MED ORDER — PREDNISONE 20 MG PO TABS
ORAL_TABLET | ORAL | 0 refills | Status: AC
Start: 2024-01-29 — End: ?

## 2024-01-29 MED ORDER — METHYLPREDNISOLONE SODIUM SUCC 125 MG IJ SOLR
125.0000 mg | Freq: Once | INTRAMUSCULAR | Status: AC
  Administered 2024-01-29: 125 mg via INTRAVENOUS
  Filled 2024-01-29: qty 2

## 2024-01-29 NOTE — ED Provider Notes (Signed)
 [place] EMERGENCY DEPARTMENT AT [facility] Provider Note   CSN: [id number] Arrival date & time: 01/29/24  1901     Patient presents with: Allergic Reaction   Jessica Nunez is a 58 y.o. female.   Patient states she thinks she is having allergic reaction to something.  She is having some itching on her feet and redness in her legs.  She has taken Benadryl  and it has helped  The history is provided by the patient and medical records. No language interpreter was used.  Allergic Reaction Presenting symptoms: itching and rash   Presenting symptoms: no difficulty breathing   Severity:  Moderate Prior allergic episodes:  No prior episodes Context: not animal exposure   Relieved by:  Antihistamines Worsened by:  Nothing Ineffective treatments:  Rest      Prior to Admission medications   Medication Sig Start Date End Date Taking? Authorizing Provider  famotidine  (PEPCID ) 20 MG tablet Take 1 tablet (20 mg total) by mouth 2 (two) times daily. 01/29/24  Yes Michele Judy, MD  predniSONE  (DELTASONE ) 20 MG tablet 2 tabs po daily x 3 days 01/29/24  Yes Dorine Duffey, MD  buPROPion (WELLBUTRIN XL) 300 MG 24 hr tablet Take 300 mg by mouth daily. 10/16/21   [provider]  cariprazine (VRAYLAR) 1.5 MG capsule Take 1.5 mg by mouth daily.    [provider]  citalopram (CELEXA) 40 MG tablet Take 40 mg by mouth daily.    [provider]  diphenhydrAMINE  (BENADRYL ) 25 mg capsule Take 25 mg by mouth every 6 (six) hours as needed for itching.    [provider]  ferrous sulfate 325 (65 FE) MG tablet Take 325 mg by mouth daily.     [provider]  folic acid  (FOLVITE ) 1 MG tablet Take 1 tablet (1 mg total) by mouth daily. 10/15/23   Heilingoetter, Cassandra L, PA-C  furosemide (LASIX) 20 MG tablet Take 20 mg by mouth daily.    [provider]  Multiple Vitamins-Minerals (MULTIVITAMIN PO) Take 1 tablet by mouth daily.    [provider]  omeprazole (PRILOSEC) 20 MG capsule TAKE 1 CAPSULE BY MOUTH EVERY DAY 10/21/15   Wicker, Cheryl, NP  oxyCODONE-acetaminophen (PERCOCET) 7.5-325 MG tablet Take 1 tablet by mouth every 8 (eight) hours as needed for severe pain.    [provider]  potassium chloride (KLOR-CON) 10 MEQ tablet Take 10 mEq by mouth daily. 09/19/21   [provider]  SENNOSIDES PO Take 1 tablet by mouth as needed.    [provider]    Allergies: Hydromorphone and Hydrocodone    Review of Systems  Constitutional:  Negative for appetite change and fatigue.  HENT:  Negative for congestion, ear discharge and sinus pressure.   Eyes:  Negative for discharge.  Respiratory:  Negative for cough.   Cardiovascular:  Negative for chest pain.  Gastrointestinal:  Negative for abdominal pain and diarrhea.  Genitourinary:  Negative for frequency and hematuria.  Musculoskeletal:  Negative for back pain.  Skin:  Positive for itching and rash.  Neurological:  Negative for seizures and headaches.  Psychiatric/Behavioral:  Negative for hallucinations.     Updated Vital Signs BP 124/83   Pulse 76   Temp 97.9 F (36.6 C) (Oral)   Resp (!) 23   Ht 5' 2 (1.575 m)   Wt 77.1 kg   LMP 04/05/2014 (Approximate)   SpO2 94%   BMI 31.09 kg/m   Physical Exam Vitals and nursing  note reviewed.  Constitutional:      Appearance: She is well-developed.  HENT:     Head: Normocephalic.     Nose: Nose normal.  Eyes:     General: No scleral icterus.    Conjunctiva/sclera: Conjunctivae normal.  Neck:     Thyroid : No thyromegaly.  Cardiovascular:     Rate and Rhythm: Normal rate and regular rhythm.     Heart sounds: No murmur heard.    No friction rub. No gallop.  Pulmonary:     Breath sounds: No stridor. No wheezing or rales.  Chest:     Chest wall: No tenderness.  Abdominal:     General: There is no distension.     Tenderness: There is no abdominal tenderness. There is no rebound.   Musculoskeletal:        General: Normal range of motion.     Cervical back: Neck supple.     Comments: Rash to both lower extremities  Lymphadenopathy:     Cervical: No cervical adenopathy.  Skin:    Findings: No erythema or rash.  Neurological:     Mental Status: She is oriented to person, place, and time.     Motor: No abnormal muscle tone.     Coordination: Coordination normal.  Psychiatric:        Behavior: Behavior normal.     (all labs ordered are listed, but only abnormal results are displayed) Labs Reviewed  COMPREHENSIVE METABOLIC PANEL WITH GFR - Abnormal; Notable for the following components:      Result Value   Potassium 3.3 (*)    CO2 21 (*)    Glucose, Bld 139 (*)    Calcium 7.8 (*)    Total Protein 5.9 (*)    Albumin 3.1 (*)    AST 43 (*)    ALT 54 (*)    All other components within normal limits  CBC WITH DIFFERENTIAL/PLATELET    EKG: None  Radiology: No results found.   Procedures   Medications Ordered in the ED  famotidine  (PEPCID ) IVPB 20 mg premix (0 mg Intravenous Stopped 01/29/24 2014)  methylPREDNISolone  sodium succinate (SOLU-MEDROL ) 125 mg/2 mL injection 125 mg (125 mg Intravenous Given 01/29/24 1937)   Patient with mild allergic reaction.  She improved with Benadryl  Pepcid  and Solu-Medrol .                                 Medical Decision Making Amount and/or Complexity of Data Reviewed Labs: ordered. ECG/medicine tests: ordered.  Risk Prescription drug management.   Allergic reaction that has improved.  She will be sent home with Pepcid  and prednisone  take Benadryl .  She will follow-up with PCP if any problem     Final diagnoses:  Allergic reaction, initial encounter    ED Discharge Orders          Ordered    predniSONE  (DELTASONE ) 20 MG tablet        01/29/24 2101    famotidine  (PEPCID ) 20 MG tablet  2 times daily        01/29/24 2101               Suzette Pac, MD 01/30/24 1109

## 2024-01-29 NOTE — ED Notes (Signed)
 AVS provided by edp was reviewed with the pt. Pt was able to verbalize understanding, verify pharmacy, and denies any additional questions at this time. Pt going home with s/o at bedside

## 2024-01-29 NOTE — ED Triage Notes (Signed)
 Pt having reaction to unknown cause that started roughly 30 min ago. Pt complaining of her feet and hands feeling like they're on fire, pt's legs are red and hot, pt stated that her throat was also itchy but she took 3 25 mg benadryl  PTA and that has helped some.

## 2024-01-29 NOTE — Discharge Instructions (Signed)
 Take Benadryl  every 4-6 hours for any itching or rash.  Follow-up with your doctor if problems

## 2024-02-04 DIAGNOSIS — F411 Generalized anxiety disorder: Secondary | ICD-10-CM | POA: Diagnosis not present

## 2024-02-04 DIAGNOSIS — F5105 Insomnia due to other mental disorder: Secondary | ICD-10-CM | POA: Diagnosis not present

## 2024-02-04 DIAGNOSIS — Z79899 Other long term (current) drug therapy: Secondary | ICD-10-CM | POA: Diagnosis not present

## 2024-02-04 DIAGNOSIS — F330 Major depressive disorder, recurrent, mild: Secondary | ICD-10-CM | POA: Diagnosis not present

## 2024-02-09 NOTE — Progress Notes (Deleted)
 [facility] OFFICE PROGRESS NOTE  Suanne Pfeiffer, NP [address] 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 Completed in February 10,2023   CURRENT THERAPY:  Ferrous sulfate 325 mg p.o. daily   INTERVAL HISTORY: Jessica Nunez 58 y.o. female returns  returns to the clinic today for a follow-up visit.  The patient was last seen in the clinic in may 2025. The patient has a history of iron  deficiency anemia secondary to gastric bypass surgery.  She receives IV iron  as needed. The most recent being in February 2023. She does take an oral iron  supplement daily and is compliant.   Overall, she is having more fatigue. ***She craves yogurt and blueberries when she is iron  deficient per patient report. She also used to crave circus peanuts. She denies any shortness of breath, lightheadedness, or syncope.  Denies any abnormal bleeding or bruising.  She is not sure if she is up to date on her colonoscopy. She goes to [facility] med. She denies any fever, chills, night sweats, or unexplained weight loss.  She is here today for evaluation and to get reestablished.   MEDICAL HISTORY: Past Medical History:  Diagnosis Date   ADHD (attention deficit hyperactivity disorder)    Fibromyalgia    Hypothyroid    Iron  deficiency    Iron  deficiency anemia 05/10/2012   Tachycardia     ALLERGIES:  is allergic to hydromorphone and hydrocodone.  MEDICATIONS:  Current Outpatient Medications  Medication Sig Dispense Refill   buPROPion (WELLBUTRIN XL) 300 MG 24 hr tablet Take 300 mg by mouth daily.     cariprazine (VRAYLAR) 1.5 MG capsule Take 1.5 mg by mouth daily.     citalopram (CELEXA) 40 MG tablet Take 40 mg by mouth daily.     diphenhydrAMINE  (BENADRYL ) 25 mg capsule Take 25 mg  by mouth every 6 (six) hours as needed for itching.     famotidine  (PEPCID ) 20 MG tablet Take 1 tablet (20 mg total) by mouth 2 (two) times daily. 10 tablet 0   ferrous sulfate 325 (65 FE) MG tablet Take 325 mg by mouth daily.      folic acid  (FOLVITE ) 1 MG tablet Take 1 tablet (1 mg total) by mouth daily. 30 tablet 0   furosemide (LASIX) 20 MG tablet Take 20 mg by mouth daily.     Multiple Vitamins-Minerals (MULTIVITAMIN PO) Take 1 tablet by mouth daily.     omeprazole (PRILOSEC) 20 MG capsule TAKE 1 CAPSULE BY MOUTH EVERY DAY 30 capsule 0   oxyCODONE-acetaminophen (PERCOCET) 7.5-325 MG tablet Take 1 tablet by mouth every 8 (eight) hours as needed for severe pain.     potassium chloride (KLOR-CON) 10 MEQ tablet Take 10 mEq by mouth daily.     predniSONE  (DELTASONE ) 20 MG tablet 2 tabs po daily x 3 days 6 tablet 0   SENNOSIDES PO Take 1 tablet by mouth as needed.     No current facility-administered medications for this visit.    SURGICAL HISTORY:  Past Surgical History:  Procedure Laterality Date   BACK SURGERY     CHOLECYSTECTOMY     COLONOSCOPY  04/14/12   ESOPHAGOGASTRODUODENOSCOPY  04/14/12   GASTRIC BYPASS     KNEE ARTHROSCOPY     right  LASIK      REVIEW OF SYSTEMS:   Review of Systems  Constitutional: Negative for appetite change, chills, fatigue, fever and unexpected weight change.  HENT:   Negative for mouth sores, nosebleeds, sore throat and trouble swallowing.   Eyes: Negative for eye problems and icterus.  Respiratory: Negative for cough, hemoptysis, shortness of breath and wheezing.   Cardiovascular: Negative for chest pain and leg swelling.  Gastrointestinal: Negative for abdominal pain, constipation, diarrhea, nausea and vomiting.  Genitourinary: Negative for bladder incontinence, difficulty urinating, dysuria, frequency and hematuria.   Musculoskeletal: Negative for back pain, gait problem, neck pain and neck stiffness.  Skin: Negative for itching and rash.   Neurological: Negative for dizziness, extremity weakness, gait problem, headaches, light-headedness and seizures.  Hematological: Negative for adenopathy. Does not bruise/bleed easily.  Psychiatric/Behavioral: Negative for confusion, depression and sleep disturbance. The patient is not nervous/anxious.     PHYSICAL EXAMINATION:  Last menstrual period 04/05/2014.  ECOG PERFORMANCE STATUS: {CHL ONC ECOG [id number]  Physical Exam  Constitutional: Oriented to person, place, and time and well-developed, well-nourished, and in no distress. No distress.  HENT:  Head: Normocephalic and atraumatic.  Mouth/Throat: Oropharynx is clear and moist. No oropharyngeal exudate.  Eyes: Conjunctivae are normal. Right eye exhibits no discharge. Left eye exhibits no discharge. No scleral icterus.  Neck: Normal range of motion. Neck supple.  Cardiovascular: Normal rate, regular rhythm, normal heart sounds and intact distal pulses.   Pulmonary/Chest: Effort normal and breath sounds normal. No respiratory distress. No wheezes. No rales.  Abdominal: Soft. Bowel sounds are normal. Exhibits no distension and no mass. There is no tenderness.  Musculoskeletal: Normal range of motion. Exhibits no edema.  Lymphadenopathy:    No cervical adenopathy.  Neurological: Alert and oriented to person, place, and time. Exhibits normal muscle tone. Gait normal. Coordination normal.  Skin: Skin is warm and dry. No rash noted. Not diaphoretic. No erythema. No pallor.  Psychiatric: Mood, memory and judgment normal.  Vitals reviewed.  LABORATORY DATA: Lab Results  Component Value Date   WBC 8.3 01/29/2024   HGB 12.5 01/29/2024   HCT 38.8 01/29/2024   MCV 92.8 01/29/2024   PLT 266 01/29/2024      Chemistry      Component Value Date/Time   NA 136 01/29/2024 1942   NA 143 01/09/2015 0900   K 3.3 (L) 01/29/2024 1942   CL 106 01/29/2024 1942   CO2 21 (L) 01/29/2024 1942   BUN 14 01/29/2024 1942   BUN 10 01/09/2015  0900   CREATININE 0.74 01/29/2024 1942   CREATININE 0.50 10/15/2023 0946      Component Value Date/Time   CALCIUM 7.8 (L) 01/29/2024 1942   ALKPHOS 98 01/29/2024 1942   AST 43 (H) 01/29/2024 1942   AST 35 10/15/2023 0946   ALT 54 (H) 01/29/2024 1942   ALT 49 (H) 10/15/2023 0946   BILITOT 0.6 01/29/2024 1942   BILITOT 0.4 10/15/2023 0946       RADIOGRAPHIC STUDIES:  No results found.   ASSESSMENT/PLAN:  This is a very pleasant 58 year old Caucasian female with iron  deficiency anemia secondary to gastric bypass surgery.  She previously had lack of response to oral iron  supplements.  She was previously treated with IV iron  with Venofer  300 mg weekly x 3 the most recent dose on 07/25/2021.   The patient had repeat CBC, CMP, iron  studies, ferritin, and folate testing.  Her labs today showed normal CBC. Her calcium is low on CMP (she takes  supplements and tums). Her folate, iron , and ferritin are pending.    Ferritin is still pending.  If this is significantly low we will arrange for IV iron  infusions with Venofer  at the [facility] infusion center. I will send her a mychart message with her results.    Will see her back for labs and follow-up with Dr. Sherrod in *** months.   She will continue taking her iron  supplement p.o. daily  The patient was advised to call immediately if she has any concerning symptoms in the interval. The patient voices understanding of current disease status and treatment options and is in agreement with the current care plan. All questions were answered. The patient knows to call the clinic with any problems, questions or concerns. We can certainly see the patient much sooner if necessary   No orders of the defined types were placed in this encounter.    I spent {CHL ONC TIME VISIT - DTPQU:[id number]} counseling the patient face to face. The total time spent in the appointment was {CHL ONC TIME VISIT - DTPQU:[id number]}.  Dustina Scoggin L Kennadi Albany,  PA-C 02/09/24

## 2024-02-15 ENCOUNTER — Inpatient Hospital Stay: Attending: Internal Medicine

## 2024-02-15 ENCOUNTER — Inpatient Hospital Stay: Admitting: Physician Assistant

## 2024-02-15 DIAGNOSIS — D508 Other iron deficiency anemias: Secondary | ICD-10-CM | POA: Insufficient documentation

## 2024-02-15 DIAGNOSIS — Z9884 Bariatric surgery status: Secondary | ICD-10-CM | POA: Insufficient documentation

## 2024-02-15 DIAGNOSIS — K912 Postsurgical malabsorption, not elsewhere classified: Secondary | ICD-10-CM | POA: Insufficient documentation

## 2024-02-17 DIAGNOSIS — F332 Major depressive disorder, recurrent severe without psychotic features: Secondary | ICD-10-CM | POA: Diagnosis not present

## 2024-02-17 DIAGNOSIS — F411 Generalized anxiety disorder: Secondary | ICD-10-CM | POA: Diagnosis not present

## 2024-02-17 DIAGNOSIS — F5105 Insomnia due to other mental disorder: Secondary | ICD-10-CM | POA: Diagnosis not present

## 2024-02-21 ENCOUNTER — Telehealth: Payer: Self-pay | Admitting: Physician Assistant

## 2024-02-21 DIAGNOSIS — E559 Vitamin D deficiency, unspecified: Secondary | ICD-10-CM | POA: Diagnosis not present

## 2024-02-21 DIAGNOSIS — E213 Hyperparathyroidism, unspecified: Secondary | ICD-10-CM | POA: Diagnosis not present

## 2024-02-21 NOTE — Telephone Encounter (Signed)
 Scheduled appointments per staff message. Talked with the patient and she is aware of the made appointments.

## 2024-02-22 DIAGNOSIS — M47812 Spondylosis without myelopathy or radiculopathy, cervical region: Secondary | ICD-10-CM | POA: Diagnosis not present

## 2024-02-22 DIAGNOSIS — M48061 Spinal stenosis, lumbar region without neurogenic claudication: Secondary | ICD-10-CM | POA: Diagnosis not present

## 2024-02-22 DIAGNOSIS — F1120 Opioid dependence, uncomplicated: Secondary | ICD-10-CM | POA: Diagnosis not present

## 2024-02-28 DIAGNOSIS — M810 Age-related osteoporosis without current pathological fracture: Secondary | ICD-10-CM | POA: Diagnosis not present

## 2024-02-29 NOTE — Progress Notes (Signed)
 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 Completed in February 10,2023.   CURRENT THERAPY: Ferrous sulfate 325 mg p.o. daily   INTERVAL HISTORY: Jessica Nunez 58 y.o. female returns to the clinic today for a follow-up visit. The patient was last seen in the clinic in May 2025. The patient has a history of iron  deficiency anemia secondary to gastric bypass surgery. She receives IV iron  as needed. The most recent being in February 2023. She does take an oral iron  supplement daily and is compliant.   She had her labs drawn at her PCPs office and her ferritin was low at 18.  She reports she can tell when her iron  is low.  She has been craving ice chips.  She is also had some fatigue.  She typically needs IV iron  every 2 years.  She is about 2 years out from her last iron  infusion.  No shortness of breath, but she reports dizziness upon standing and excessive sleepiness. The dizziness resolves after about thirty seconds. She denies any abnormal bleeding, such as epistaxis, gum bleeding, or gastrointestinal bleeding. She confirms being up to date with her colonoscopies and has no fevers, chills, or night sweats.  She recently changed her medication regimen, increasing her buspirone to three times a day, which she feels has improved her condition.   MEDICAL HISTORY: Past Medical History:  Diagnosis Date   ADHD (attention deficit hyperactivity disorder)    Fibromyalgia    Hypothyroid    Iron  deficiency    Iron  deficiency anemia 05/10/2012   Tachycardia     ALLERGIES:  is allergic to hydromorphone and hydrocodone.  MEDICATIONS:  Current Outpatient Medications  Medication Sig Dispense  Refill   busPIRone (BUSPAR) 10 MG tablet Take 10 mg by mouth 3 (three) times daily.     denosumab  (PROLIA ) 60 MG/ML SOSY injection Inject 60 mg into the skin every 6 (six) months.     lidocaine (XYLOCAINE) 5 % ointment Apply 1 Application topically daily as needed.     Multiple Vitamin (MULTIVITAMIN) tablet Take 1 tablet by mouth daily.     omeprazole (PRILOSEC) 20 MG capsule TAKE 1 CAPSULE BY MOUTH EVERY DAY (Patient taking differently: Take 20 mg by mouth 2 (two) times daily. 40 mg daily) 30 capsule 0   OVER THE COUNTER MEDICATION      potassium bicarbonate (K-LYTE) 25 MEQ disintegrating tablet Take 25 mEq by mouth 2 (two) times daily.     topiramate (TOPAMAX) 50 MG tablet Take 50 mg by mouth daily.     amphetamine-dextroamphetamine (ADDERALL) 30 MG tablet Take 30 mg by mouth 2 (two) times daily.     betamethasone  valerate ointment (VALISONE ) 0.1 % Apply 1 Application topically daily.     buPROPion (WELLBUTRIN XL) 300 MG 24 hr tablet Take 300 mg by mouth daily.     cariprazine (VRAYLAR) 1.5 MG capsule Take 1.5 mg by mouth daily.     citalopram (CELEXA) 40 MG tablet Take 40 mg by mouth daily.     estradiol (ESTRACE) 0.1 MG/GM vaginal cream Place 1 Applicatorful vaginally daily.     famotidine  (PEPCID ) 20 MG tablet Take 1 tablet (20 mg total) by mouth 2 (two) times  daily. 10 tablet 0   ferrous sulfate 325 (65 FE) MG tablet Take 325 mg by mouth daily.      folic acid  (FOLVITE ) 1 MG tablet Take 1 tablet (1 mg total) by mouth daily. 30 tablet 0   furosemide (LASIX) 20 MG tablet Take 20 mg by mouth daily.     oxyCODONE-acetaminophen (PERCOCET/ROXICET) 5-325 MG tablet Take 1 tablet by mouth every 8 (eight) hours as needed.     Oyster Shell Calcium 500 MG TABS Take 1,250 mg by mouth daily.     potassium chloride (KLOR-CON) 10 MEQ tablet Take 10 mEq by mouth daily.     predniSONE  (DELTASONE ) 20 MG tablet 2 tabs po daily x 3 days 6 tablet 0   No current facility-administered medications for this  visit.    SURGICAL HISTORY:  Past Surgical History:  Procedure Laterality Date   BACK SURGERY     CHOLECYSTECTOMY     COLONOSCOPY  04/14/12   ESOPHAGOGASTRODUODENOSCOPY  04/14/12   GASTRIC BYPASS     KNEE ARTHROSCOPY     right   LASIK      REVIEW OF SYSTEMS:   Review of Systems  Constitutional: Positive for fatigue. Negative for appetite change, chills, fever and unexpected weight change.  HENT: Negative for mouth sores, nosebleeds, sore throat and trouble swallowing.   Eyes: Negative for eye problems and icterus.  Respiratory: Negative for cough, hemoptysis, shortness of breath and wheezing.   Cardiovascular: Negative for chest pain and leg swelling.  Gastrointestinal: Negative for abdominal pain, constipation, diarrhea, nausea and vomiting.  Genitourinary: Negative for bladder incontinence, difficulty urinating, dysuria, frequency and hematuria.   Musculoskeletal: Negative for back pain, gait problem, neck pain and neck stiffness.  Skin: Negative for itching and rash.  Neurological: Negative for dizziness, extremity weakness, gait problem, headaches, light-headedness and seizures.  Hematological: Negative for adenopathy. Does not bruise/bleed easily.  Psychiatric/Behavioral: Negative for confusion, depression and sleep disturbance. The patient is not nervous/anxious.     PHYSICAL EXAMINATION:  Blood pressure 125/82, pulse 79, temperature 97.7 F (36.5 C), temperature source Temporal, resp. rate 15, weight 173 lb 8 oz (78.7 kg), last menstrual period 04/05/2014, SpO2 98%.  ECOG PERFORMANCE STATUS: 1  Physical Exam  Constitutional: Oriented to person, place, and time and well-developed, well-nourished, and in no distress.   HENT:  Head: Normocephalic and atraumatic.  Mouth/Throat: Oropharynx is clear and moist. No oropharyngeal exudate.  Eyes: Conjunctivae are normal. Right eye exhibits no discharge. Left eye exhibits no discharge. No scleral icterus.  Neck: Normal range  of motion. Neck supple.  Cardiovascular: Normal rate, regular rhythm, normal heart sounds and intact distal pulses.   Pulmonary/Chest: Effort normal and breath sounds normal. No respiratory distress. No wheezes. No rales.  Abdominal: Soft. Bowel sounds are normal. Exhibits no distension and no mass. There is no tenderness.  Musculoskeletal: Normal range of motion. Exhibits no edema.  Lymphadenopathy:    No cervical adenopathy.  Neurological: Alert and oriented to person, place, and time. Exhibits normal muscle tone. Gait normal. Coordination normal.  Skin: Skin is warm and dry. No rash noted. Not diaphoretic. No erythema. No pallor.  Psychiatric: Mood, memory and judgment normal.  Vitals reviewed.  LABORATORY DATA: Lab Results  Component Value Date   WBC 6.3 03/06/2024   HGB 11.8 (L) 03/06/2024   HCT 36.9 03/06/2024   MCV 89.6 03/06/2024   PLT 264 03/06/2024      Chemistry      Component Value Date/Time  NA 136 01/29/2024 1942   NA 143 01/09/2015 0900   K 3.3 (L) 01/29/2024 1942   CL 106 01/29/2024 1942   CO2 21 (L) 01/29/2024 1942   BUN 14 01/29/2024 1942   BUN 10 01/09/2015 0900   CREATININE 0.74 01/29/2024 1942   CREATININE 0.50 10/15/2023 0946      Component Value Date/Time   CALCIUM 7.8 (L) 01/29/2024 1942   ALKPHOS 98 01/29/2024 1942   AST 43 (H) 01/29/2024 1942   AST 35 10/15/2023 0946   ALT 54 (H) 01/29/2024 1942   ALT 49 (H) 10/15/2023 0946   BILITOT 0.6 01/29/2024 1942   BILITOT 0.4 10/15/2023 0946       RADIOGRAPHIC STUDIES:  No results found.   ASSESSMENT/PLAN:  This is a very pleasant 58 year old Caucasian female with iron  deficiency anemia secondary to gastric bypass surgery.  She previously had lack of response to oral iron  supplements.  She was previously treated with IV iron  with Venofer  300 mg weekly x 3 the most recent dose on 07/25/2021.   Repeat CBC and iron  studies.  Her liron studies are pending.  If her ferritin and iron  studies have  decreased despite taking her oral iron  supplement then I will arrange for IV iron  infusions with Venofer .    If this is significantly low we will arrange for IV iron  infusions with Venofer  at the [facility] infusion center. I will send her a mychart message with her results.    Will see her back for labs and follow-up with Dr. Sherrod in 4 months.   She will continue taking her iron  supplement p.o. daily  The patient was advised to call immediately if she has any concerning symptoms in the interval. The patient voices understanding of current disease status and treatment options and is in agreement with the current care plan. All questions were answered. The patient knows to call the clinic with any problems, questions or concerns. We can certainly see the patient much sooner if necessary   No orders of the defined types were placed in this encounter.    The total time spent in the appointment was 20-29 minutes  Ziyad Dyar L Demitrious Mccannon, PA-C 03/06/24

## 2024-03-02 DIAGNOSIS — F332 Major depressive disorder, recurrent severe without psychotic features: Secondary | ICD-10-CM | POA: Diagnosis not present

## 2024-03-02 DIAGNOSIS — F411 Generalized anxiety disorder: Secondary | ICD-10-CM | POA: Diagnosis not present

## 2024-03-06 ENCOUNTER — Inpatient Hospital Stay: Admitting: Physician Assistant

## 2024-03-06 ENCOUNTER — Inpatient Hospital Stay

## 2024-03-06 ENCOUNTER — Other Ambulatory Visit: Payer: Self-pay | Admitting: Physician Assistant

## 2024-03-06 ENCOUNTER — Encounter: Payer: Self-pay | Admitting: Physician Assistant

## 2024-03-06 ENCOUNTER — Telehealth: Payer: Self-pay

## 2024-03-06 VITALS — BP 125/82 | HR 79 | Temp 97.7°F | Resp 15 | Wt 173.5 lb

## 2024-03-06 DIAGNOSIS — D509 Iron deficiency anemia, unspecified: Secondary | ICD-10-CM | POA: Diagnosis not present

## 2024-03-06 DIAGNOSIS — Z9884 Bariatric surgery status: Secondary | ICD-10-CM | POA: Diagnosis not present

## 2024-03-06 DIAGNOSIS — D508 Other iron deficiency anemias: Secondary | ICD-10-CM | POA: Diagnosis not present

## 2024-03-06 DIAGNOSIS — K912 Postsurgical malabsorption, not elsewhere classified: Secondary | ICD-10-CM | POA: Diagnosis not present

## 2024-03-06 LAB — CBC WITH DIFFERENTIAL (CANCER CENTER ONLY)
Abs Immature Granulocytes: 0.02 K/uL (ref 0.00–0.07)
Basophils Absolute: 0.1 K/uL (ref 0.0–0.1)
Basophils Relative: 1 %
Eosinophils Absolute: 0.2 K/uL (ref 0.0–0.5)
Eosinophils Relative: 2 %
HCT: 36.9 % (ref 36.0–46.0)
Hemoglobin: 11.8 g/dL — ABNORMAL LOW (ref 12.0–15.0)
Immature Granulocytes: 0 %
Lymphocytes Relative: 25 %
Lymphs Abs: 1.6 K/uL (ref 0.7–4.0)
MCH: 28.6 pg (ref 26.0–34.0)
MCHC: 32 g/dL (ref 30.0–36.0)
MCV: 89.6 fL (ref 80.0–100.0)
Monocytes Absolute: 0.4 K/uL (ref 0.1–1.0)
Monocytes Relative: 7 %
Neutro Abs: 4.1 K/uL (ref 1.7–7.7)
Neutrophils Relative %: 65 %
Platelet Count: 264 K/uL (ref 150–400)
RBC: 4.12 MIL/uL (ref 3.87–5.11)
RDW: 14.4 % (ref 11.5–15.5)
WBC Count: 6.3 K/uL (ref 4.0–10.5)
nRBC: 0 % (ref 0.0–0.2)

## 2024-03-06 LAB — IRON AND IRON BINDING CAPACITY (CC-WL,HP ONLY)
Iron: 26 ug/dL — ABNORMAL LOW (ref 28–170)
Saturation Ratios: 6 % — ABNORMAL LOW (ref 10.4–31.8)
TIBC: 449 ug/dL (ref 250–450)
UIBC: 423 ug/dL (ref 148–442)

## 2024-03-06 LAB — FERRITIN: Ferritin: 11 ng/mL (ref 11–307)

## 2024-03-06 NOTE — Telephone Encounter (Signed)
 Jessica Nunez, patient will be scheduled as soon as possible.  Auth Submission: NO AUTH NEEDED Site of care: Site of care: [facility] Payer: [employer] commercial Medication & CPT/J Code(s) submitted: Venofer  (Iron  Sucrose) J1756 Diagnosis Code:  Route of submission (phone, fax, portal):  Phone # Fax # Auth type: Buy/Bill PB Units/visits requested: 300mg  x 3 doses Reference number:  Approval from: 03/06/24 to 06/14/24

## 2024-03-07 ENCOUNTER — Telehealth: Payer: Self-pay | Admitting: Physician Assistant

## 2024-03-07 NOTE — Telephone Encounter (Signed)
 Left the patient a voicemail with the scheduled appointment details.

## 2024-03-16 DIAGNOSIS — F411 Generalized anxiety disorder: Secondary | ICD-10-CM | POA: Diagnosis not present

## 2024-03-16 DIAGNOSIS — F332 Major depressive disorder, recurrent severe without psychotic features: Secondary | ICD-10-CM | POA: Diagnosis not present

## 2024-03-22 ENCOUNTER — Ambulatory Visit (INDEPENDENT_AMBULATORY_CARE_PROVIDER_SITE_OTHER)

## 2024-03-22 VITALS — BP 116/79 | HR 72 | Temp 98.0°F | Resp 16 | Ht 62.0 in | Wt 172.6 lb

## 2024-03-22 DIAGNOSIS — D509 Iron deficiency anemia, unspecified: Secondary | ICD-10-CM | POA: Diagnosis not present

## 2024-03-22 MED ORDER — SODIUM CHLORIDE 0.9 % IV SOLN
300.0000 mg | Freq: Once | INTRAVENOUS | Status: AC
  Administered 2024-03-22: 300 mg via INTRAVENOUS
  Filled 2024-03-22: qty 15

## 2024-03-22 MED ORDER — DIPHENHYDRAMINE HCL 25 MG PO CAPS: 25.0000 mg | ORAL_CAPSULE | Freq: Once | ORAL | Status: DC

## 2024-03-22 MED ORDER — ACETAMINOPHEN 325 MG PO TABS: 650.0000 mg | ORAL_TABLET | Freq: Once | ORAL | Status: DC

## 2024-03-22 NOTE — Progress Notes (Signed)
 Diagnosis: Iron  Deficiency Anemia  Provider:  Praveen Mannam MD  Procedure: IV Infusion  IV Type: Peripheral, IV Location: R Antecubital  Venofer  (Iron  Sucrose), Dose: 300 mg  Infusion Start Time: 1343  Infusion Stop Time: 1524  Post Infusion IV Care: Patient declined observation and Peripheral IV Discontinued  Discharge: Condition: Good, Destination: Home . AVS Declined  Performed by:  Maximiano JONELLE Pouch, LPN   Patient refused to take benadryl . Nurse educated patient and stressed the importance of taking pre-medications as a precaution in the event of a medication reaction. Patient verbalized understanding.

## 2024-03-29 ENCOUNTER — Ambulatory Visit

## 2024-03-29 VITALS — BP 132/81 | HR 70 | Temp 98.5°F | Resp 18 | Ht 62.0 in | Wt 177.0 lb

## 2024-03-29 DIAGNOSIS — D509 Iron deficiency anemia, unspecified: Secondary | ICD-10-CM

## 2024-03-29 MED ORDER — SODIUM CHLORIDE 0.9 % IV SOLN
300.0000 mg | Freq: Once | INTRAVENOUS | Status: AC
  Administered 2024-03-29: 300 mg via INTRAVENOUS
  Filled 2024-03-29: qty 15

## 2024-03-29 MED ORDER — SODIUM CHLORIDE 0.9 % IV SOLN: Freq: Once | INTRAVENOUS | Status: DC | PRN

## 2024-03-29 MED ORDER — ACETAMINOPHEN 325 MG PO TABS: 650.0000 mg | ORAL_TABLET | Freq: Once | ORAL | Status: DC

## 2024-03-29 MED ORDER — METHYLPREDNISOLONE SODIUM SUCC 125 MG IJ SOLR: 125.0000 mg | Freq: Once | INTRAMUSCULAR | Status: DC | PRN

## 2024-03-29 MED ORDER — DIPHENHYDRAMINE HCL 50 MG/ML IJ SOLN: 50.0000 mg | Freq: Once | INTRAMUSCULAR | Status: DC | PRN

## 2024-03-29 MED ORDER — ANTICOAGULANT SODIUM CITRATE 4% (200MG/5ML) IV SOLN: 5.0000 mL | Freq: Once | Status: DC | PRN

## 2024-03-29 MED ORDER — HEPARIN SOD (PORK) LOCK FLUSH 100 UNIT/ML IV SOLN: 500.0000 [IU] | Freq: Once | INTRAVENOUS | Status: DC | PRN

## 2024-03-29 MED ORDER — SODIUM CHLORIDE 0.9% FLUSH: 10.0000 mL | Freq: Once | INTRAVENOUS | Status: DC | PRN

## 2024-03-29 MED ORDER — ALTEPLASE 2 MG IJ SOLR: 2.0000 mg | Freq: Once | INTRAMUSCULAR | Status: DC | PRN

## 2024-03-29 MED ORDER — DIPHENHYDRAMINE HCL 25 MG PO CAPS: 25.0000 mg | ORAL_CAPSULE | Freq: Once | ORAL | Status: DC

## 2024-03-29 MED ORDER — SODIUM CHLORIDE 0.9% FLUSH: 3.0000 mL | Freq: Once | INTRAVENOUS | Status: DC | PRN

## 2024-03-29 MED ORDER — HEPARIN SOD (PORK) LOCK FLUSH 100 UNIT/ML IV SOLN: 250.0000 [IU] | Freq: Once | INTRAVENOUS | Status: DC | PRN

## 2024-03-29 MED ORDER — EPINEPHRINE 0.3 MG/0.3ML IJ SOAJ: 0.3000 mg | Freq: Once | INTRAMUSCULAR | Status: DC | PRN

## 2024-03-29 MED ORDER — FAMOTIDINE IN NACL 20-0.9 MG/50ML-% IV SOLN: 20.0000 mg | Freq: Once | INTRAVENOUS | Status: DC | PRN

## 2024-03-29 MED ORDER — ALBUTEROL SULFATE HFA 108 (90 BASE) MCG/ACT IN AERS: 2.0000 | INHALATION_SPRAY | Freq: Once | RESPIRATORY_TRACT | Status: DC | PRN

## 2024-03-29 NOTE — Progress Notes (Signed)
 Diagnosis: Iron  Deficiency Anemia  Provider:  Praveen Mannam MD  Procedure: IV Infusion  IV Type: Peripheral, IV Location: R Antecubital  Venofer  (Iron  Sucrose), Dose: 300 mg  Infusion Start Time: 1356  Infusion Stop Time: 1537  Post Infusion IV Care: Patient declined observation and Peripheral IV Discontinued  Discharge: Condition: Good, Destination: Home . AVS Declined  Performed by:  Phuong Hillary, RN

## 2024-04-05 ENCOUNTER — Ambulatory Visit (INDEPENDENT_AMBULATORY_CARE_PROVIDER_SITE_OTHER)

## 2024-04-05 VITALS — BP 128/85 | HR 74 | Temp 98.3°F | Resp 20 | Ht 62.0 in | Wt 172.4 lb

## 2024-04-05 DIAGNOSIS — D509 Iron deficiency anemia, unspecified: Secondary | ICD-10-CM | POA: Diagnosis not present

## 2024-04-05 MED ORDER — SODIUM CHLORIDE 0.9 % IV SOLN
300.0000 mg | Freq: Once | INTRAVENOUS | Status: AC
  Administered 2024-04-05: 300 mg via INTRAVENOUS
  Filled 2024-04-05: qty 15

## 2024-04-05 MED ORDER — ACETAMINOPHEN 325 MG PO TABS: 650.0000 mg | ORAL_TABLET | Freq: Once | ORAL | Status: DC

## 2024-04-05 MED ORDER — DIPHENHYDRAMINE HCL 25 MG PO CAPS: 25.0000 mg | ORAL_CAPSULE | Freq: Once | ORAL | Status: DC

## 2024-04-05 NOTE — Progress Notes (Signed)
 Diagnosis: Iron  Deficiency Anemia  Provider:  Praveen Mannam MD  Procedure: IV Infusion  IV Type: Peripheral, IV Location: L Antecubital  Venofer  (Iron  Sucrose), Dose: 300 mg  Infusion Start Time: 1347  Infusion Stop Time: 1522  Post Infusion IV Care: Patient declined observation and Peripheral IV Discontinued  Discharge: Condition: Good, Destination: Home . AVS Declined  Performed by:  Maximiano JONELLE Pouch, LPN

## 2024-04-13 DIAGNOSIS — F339 Major depressive disorder, recurrent, unspecified: Secondary | ICD-10-CM | POA: Diagnosis not present

## 2024-04-13 DIAGNOSIS — F411 Generalized anxiety disorder: Secondary | ICD-10-CM | POA: Diagnosis not present

## 2024-04-18 DIAGNOSIS — H18593 Other hereditary corneal dystrophies, bilateral: Secondary | ICD-10-CM | POA: Diagnosis not present

## 2024-04-18 DIAGNOSIS — H25093 Other age-related incipient cataract, bilateral: Secondary | ICD-10-CM | POA: Diagnosis not present

## 2024-04-18 DIAGNOSIS — Z48810 Encounter for surgical aftercare following surgery on the sense organs: Secondary | ICD-10-CM | POA: Diagnosis not present

## 2024-04-18 DIAGNOSIS — H524 Presbyopia: Secondary | ICD-10-CM | POA: Diagnosis not present

## 2024-04-18 DIAGNOSIS — H5213 Myopia, bilateral: Secondary | ICD-10-CM | POA: Diagnosis not present

## 2024-05-10 DIAGNOSIS — F411 Generalized anxiety disorder: Secondary | ICD-10-CM | POA: Diagnosis not present

## 2024-05-10 DIAGNOSIS — F329 Major depressive disorder, single episode, unspecified: Secondary | ICD-10-CM | POA: Diagnosis not present

## 2024-05-12 DIAGNOSIS — Z79899 Other long term (current) drug therapy: Secondary | ICD-10-CM | POA: Diagnosis not present

## 2024-05-12 DIAGNOSIS — F902 Attention-deficit hyperactivity disorder, combined type: Secondary | ICD-10-CM | POA: Diagnosis not present

## 2024-05-12 DIAGNOSIS — F5105 Insomnia due to other mental disorder: Secondary | ICD-10-CM | POA: Diagnosis not present

## 2024-05-12 DIAGNOSIS — D508 Other iron deficiency anemias: Secondary | ICD-10-CM | POA: Diagnosis not present

## 2024-05-12 DIAGNOSIS — F331 Major depressive disorder, recurrent, moderate: Secondary | ICD-10-CM | POA: Diagnosis not present

## 2024-05-12 DIAGNOSIS — E538 Deficiency of other specified B group vitamins: Secondary | ICD-10-CM | POA: Diagnosis not present

## 2024-05-23 DIAGNOSIS — F1120 Opioid dependence, uncomplicated: Secondary | ICD-10-CM | POA: Diagnosis not present

## 2024-05-23 DIAGNOSIS — M48061 Spinal stenosis, lumbar region without neurogenic claudication: Secondary | ICD-10-CM | POA: Diagnosis not present

## 2024-05-23 DIAGNOSIS — M47812 Spondylosis without myelopathy or radiculopathy, cervical region: Secondary | ICD-10-CM | POA: Diagnosis not present

## 2024-07-07 ENCOUNTER — Inpatient Hospital Stay: Attending: Physician Assistant

## 2024-07-07 ENCOUNTER — Other Ambulatory Visit: Payer: Self-pay | Admitting: Physician Assistant

## 2024-07-07 DIAGNOSIS — K912 Postsurgical malabsorption, not elsewhere classified: Secondary | ICD-10-CM | POA: Insufficient documentation

## 2024-07-07 DIAGNOSIS — D509 Iron deficiency anemia, unspecified: Secondary | ICD-10-CM | POA: Insufficient documentation

## 2024-07-07 DIAGNOSIS — Z9884 Bariatric surgery status: Secondary | ICD-10-CM | POA: Insufficient documentation

## 2024-07-07 LAB — CBC WITH DIFFERENTIAL (CANCER CENTER ONLY)
Abs Immature Granulocytes: 0.01 K/uL (ref 0.00–0.07)
Basophils Absolute: 0.1 K/uL (ref 0.0–0.1)
Basophils Relative: 1 %
Eosinophils Absolute: 0.3 K/uL (ref 0.0–0.5)
Eosinophils Relative: 4 %
HCT: 45.9 % (ref 36.0–46.0)
Hemoglobin: 15.3 g/dL — ABNORMAL HIGH (ref 12.0–15.0)
Immature Granulocytes: 0 %
Lymphocytes Relative: 33 %
Lymphs Abs: 2.6 K/uL (ref 0.7–4.0)
MCH: 30.4 pg (ref 26.0–34.0)
MCHC: 33.3 g/dL (ref 30.0–36.0)
MCV: 91.1 fL (ref 80.0–100.0)
Monocytes Absolute: 0.5 K/uL (ref 0.1–1.0)
Monocytes Relative: 6 %
Neutro Abs: 4.4 K/uL (ref 1.7–7.7)
Neutrophils Relative %: 56 %
Platelet Count: 240 K/uL (ref 150–400)
RBC: 5.04 MIL/uL (ref 3.87–5.11)
RDW: 12.3 % (ref 11.5–15.5)
WBC Count: 7.9 K/uL (ref 4.0–10.5)
nRBC: 0 % (ref 0.0–0.2)

## 2024-07-07 LAB — IRON AND IRON BINDING CAPACITY (CC-WL,HP ONLY)
Iron: 95 ug/dL (ref 28–170)
Saturation Ratios: 24 % (ref 10.4–31.8)
TIBC: 399 ug/dL (ref 250–450)
UIBC: 304 ug/dL

## 2024-07-07 LAB — FERRITIN: Ferritin: 147 ng/mL (ref 11–307)

## 2024-07-07 NOTE — Progress Notes (Signed)
 [facility] HEMATOLOGY-ONCOLOGY TeleHEALTH VISIT PROGRESS NOTE   I connected with Jerel Cave on 07/10/24 at  9:30 AM EST by telephone and verified that I am speaking with the correct person using two identifiers.  I discussed the limitations, risks, security and privacy concerns of performing an evaluation and management service by telemedicine and the availability of in-person appointments. I also discussed with the patient that there may be a patient responsible charge related to this service. The patient expressed understanding and agreed to proceed.  Other persons participating in the visit and their role in the encounter: None  Patients location: Home  Providers location: Home  [place], Charmaine, NP 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 Completed in 04/05/24   CURRENT THERAPY: Ferrous sulfate 325 mg p.o. daily   INTERVAL HISTORY: Jessica Nunez 59 y.o. female returns to the clinic today for a follow-up visit. The patient was last seen in the clinic in September 2025. The patient has a history of iron  deficiency anemia secondary to gastric bypass surgery. She receives IV iron  as needed. The most recent being in October 2025. She does take an oral iron  supplement daily and is compliant.   She has been feeling great since her iron  infusion and has no usual symptoms today.    Her energy is improved. She typically needs IV iron  every 2 years.     No shortness of breath or dizziness. She denies any abnormal bleeding, such as epistaxis, gum bleeding, or gastrointestinal bleeding. She confirms being up to date with her colonoscopies and has no fevers, chills, or night sweats.   The purpose of the visit today is to review her results.      MEDICAL HISTORY: Past Medical History:  Diagnosis Date   ADHD (attention deficit hyperactivity disorder)    Fibromyalgia    Hypothyroid    Iron  deficiency    Iron  deficiency anemia 05/10/2012   Tachycardia     ALLERGIES:  is allergic to hydromorphone and hydrocodone.  MEDICATIONS:  Current Outpatient Medications  Medication Sig Dispense Refill   amphetamine-dextroamphetamine (ADDERALL) 30 MG tablet Take 30 mg by mouth 2 (two) times daily.     betamethasone  valerate ointment (VALISONE ) 0.1 % Apply 1 Application topically daily.     buPROPion (WELLBUTRIN XL) 300 MG 24 hr tablet Take 300 mg by mouth daily.     busPIRone (BUSPAR) 10 MG tablet Take 10 mg by mouth 3 (three) times daily.     cariprazine (VRAYLAR) 1.5 MG capsule Take 1.5 mg by mouth daily.     citalopram (CELEXA) 40 MG tablet Take 40 mg by mouth daily.     denosumab  (PROLIA ) 60 MG/ML SOSY injection Inject 60 mg into the skin every 6 (six) months.     estradiol (ESTRACE) 0.1 MG/GM vaginal cream Place 1 Applicatorful vaginally daily.     famotidine  (PEPCID ) 20 MG tablet Take 1 tablet (20 mg total) by mouth 2 (two) times daily. 10 tablet 0   ferrous sulfate 325 (65 FE) MG tablet Take 325 mg by mouth daily.      folic acid  (FOLVITE ) 1 MG tablet Take 1 tablet (1 mg total) by mouth daily. 30 tablet 0   furosemide (LASIX) 20 MG tablet Take 20 mg by  mouth daily.     lidocaine (XYLOCAINE) 5 % ointment Apply 1 Application topically daily as needed.     Multiple Vitamin (MULTIVITAMIN) tablet Take 1 tablet by mouth daily.     omeprazole (PRILOSEC) 20 MG capsule TAKE 1 CAPSULE BY MOUTH EVERY DAY (Patient taking differently: Take 20 mg by mouth 2 (two) times daily. 40 mg daily) 30 capsule 0   OVER THE COUNTER MEDICATION      oxyCODONE-acetaminophen  (PERCOCET/ROXICET) 5-325 MG tablet Take 1 tablet by mouth every 8 (eight) hours as needed.     Oyster Shell Calcium 500 MG TABS Take 1,250 mg by mouth daily.     potassium bicarbonate  (K-LYTE) 25 MEQ disintegrating tablet Take 25 mEq by mouth 2 (two) times daily.     potassium chloride (KLOR-CON) 10 MEQ tablet Take 10 mEq by mouth daily.     predniSONE  (DELTASONE ) 20 MG tablet 2 tabs po daily x 3 days 6 tablet 0   topiramate (TOPAMAX) 50 MG tablet Take 50 mg by mouth daily.     No current facility-administered medications for this visit.    SURGICAL HISTORY:  Past Surgical History:  Procedure Laterality Date   BACK SURGERY     CHOLECYSTECTOMY     COLONOSCOPY  04/14/12   ESOPHAGOGASTRODUODENOSCOPY  04/14/12   GASTRIC BYPASS     KNEE ARTHROSCOPY     right   LASIK      REVIEW OF SYSTEMS:   Review of Systems  Constitutional: Negative for appetite change, chills, fatigue, fever and unexpected weight change.  HENT: Negative for mouth sores, nosebleeds, sore throat and trouble swallowing.   Eyes: Negative for eye problems and icterus.  Respiratory: Negative for cough, hemoptysis, shortness of breath and wheezing.   Cardiovascular: Negative for chest pain and leg swelling.  Gastrointestinal: Negative for abdominal pain, constipation, diarrhea, nausea and vomiting.  Genitourinary: Negative for bladder incontinence, difficulty urinating, dysuria, frequency and hematuria.   Musculoskeletal: Negative for back pain, gait problem, neck pain and neck stiffness.  Skin: Negative for itching and rash.  Neurological: Negative for dizziness, extremity weakness, gait problem, headaches, light-headedness and seizures.  Hematological: Negative for adenopathy. Does not bruise/bleed easily.  Psychiatric/Behavioral: Negative for confusion, depression and sleep disturbance. The patient is not nervous/anxious.     PHYSICAL EXAMINATION:  Last menstrual period 04/05/2014.  ECOG PERFORMANCE STATUS: 0  Physical Exam  Constitutional: Oriented to person, place, and time and well-developed, well-nourished, and in no distress. No distress.  Psychiatric: Mood, memory and judgment normal.   Vitals reviewed.  LABORATORY DATA: Lab Results  Component Value Date   WBC 7.9 07/07/2024   HGB 15.3 (H) 07/07/2024   HCT 45.9 07/07/2024   MCV 91.1 07/07/2024   PLT 240 07/07/2024      Chemistry      Component Value Date/Time   NA 136 01/29/2024 1942   NA 143 01/09/2015 0900   K 3.3 (L) 01/29/2024 1942   CL 106 01/29/2024 1942   CO2 21 (L) 01/29/2024 1942   BUN 14 01/29/2024 1942   BUN 10 01/09/2015 0900   CREATININE 0.74 01/29/2024 1942   CREATININE 0.50 10/15/2023 0946      Component Value Date/Time   CALCIUM 7.8 (L) 01/29/2024 1942   ALKPHOS 98 01/29/2024 1942   AST 43 (H) 01/29/2024 1942   AST 35 10/15/2023 0946   ALT 54 (H) 01/29/2024 1942   ALT 49 (H) 10/15/2023 0946   BILITOT 0.6 01/29/2024 1942   BILITOT 0.4 10/15/2023  [id number]       RADIOGRAPHIC STUDIES:  No results found.   ASSESSMENT/PLAN:  This is a very pleasant 59 year old Caucasian female with iron  deficiency anemia secondary to gastric bypass surgery.  She previously had lack of response to oral iron  supplements.  She was previously treated with IV iron  with Venofer  300 mg weekly x 3 the most recent dose on 04/05/24   Repeat CBC and iron  studies show improvement in her Hbg and iron  stores. No need for IV iron  at this time.   Will see her back for labs and follow-up with me or Dr. Sherrod in 6 months.   She will continue taking her iron  supplement p.o. daily.   I discussed the assessment and treatment plan with the patient. The patient was provided an opportunity to ask questions and all were answered. The patient agreed with the plan and demonstrated an understanding of the instructions.  The patient was advised to call back or seek an in-person evaluation if the symptoms worsen or if the condition fails to improve as anticipated.  I provided 20 minutes of non face-to-face telephone visit time during this encounter, and > 50% was spent counseling as documented under my assessment & plan.   Karee Christopherson L Jilliana Burkes, PA-C 07/10/2024 9:36 AM  Orders Placed This Encounter  Procedures   CBC with Differential (Cancer Center Only)    Standing Status:   Future    Expected Date:   01/07/2025    Expiration Date:   07/10/2025   Ferritin    Standing Status:   Future    Expected Date:   01/07/2025    Expiration Date:   07/10/2025   Iron  and Iron  Binding Capacity ([facility] only)    Standing Status:   Future    Expected Date:   01/07/2025    Expiration Date:   07/10/2025     Luann Aspinwall L Earle Burson, PA-C 07/10/24

## 2024-07-10 ENCOUNTER — Other Ambulatory Visit

## 2024-07-10 ENCOUNTER — Inpatient Hospital Stay: Admitting: Physician Assistant

## 2024-07-10 DIAGNOSIS — D509 Iron deficiency anemia, unspecified: Secondary | ICD-10-CM
